# Patient Record
Sex: Male | Born: 1950 | Race: White | Hispanic: No | Marital: Married | Smoking: Current every day smoker
Health system: Western US, Academic
[De-identification: ages and names within clinical notes are randomized; demographics above are authoritative.]

## PROBLEM LIST (undated history)

## (undated) ENCOUNTER — Encounter (HOSPITAL_COMMUNITY): Payer: Self-pay

## (undated) ENCOUNTER — Inpatient Hospital Stay (HOSPITAL_COMMUNITY): Payer: Self-pay | Admitting: Neurosurgery

## (undated) DIAGNOSIS — E785 Hyperlipidemia, unspecified: Secondary | ICD-10-CM

## (undated) DIAGNOSIS — J189 Pneumonia, unspecified organism: Secondary | ICD-10-CM

## (undated) DIAGNOSIS — E119 Type 2 diabetes mellitus without complications: Secondary | ICD-10-CM

## (undated) HISTORY — DX: Type 2 diabetes mellitus without complications: E11.9

## (undated) HISTORY — PX: ROTATOR CUFF REPAIR: SHX139

## (undated) HISTORY — PX: OTHER SURGICAL HISTORY: SHX169

## (undated) HISTORY — PX: HERNIA REPAIR: SHX51

## (undated) HISTORY — PX: PANCREATICODUODENECTOMY: SUR1000

## (undated) HISTORY — DX: Hyperlipidemia, unspecified: E78.5

## (undated) HISTORY — PX: INGUINAL HERNIA REPAIR: SUR1180

## (undated) HISTORY — PX: CARPAL TUNNEL RELEASE: SHX101

## (undated) HISTORY — PX: APPENDECTOMY: SHX54

## (undated) HISTORY — DX: Type 2 diabetes mellitus without complications (CMS-HCC): E11.9

## (undated) HISTORY — DX: Pneumonia, unspecified organism: J18.9

## (undated) SURGERY — DISCECTOMY, CERVICAL, ANTERIOR, WITH INSTRUMENTATION, NEURO
Anesthesia: General

---
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
## 1980-10-23 HISTORY — PX: PB APPENDECTOMY: 44950

## 2000-10-23 HISTORY — PX: OTHER PROCEDURE: U1053

## 2001-10-23 HISTORY — PX: OTHER PROCEDURE: U1053

## 2007-10-24 HISTORY — PX: OTHER PROCEDURE: U1053

## 2008-10-23 HISTORY — PX: OTHER PROCEDURE: U1053

## 2010-06-20 ENCOUNTER — Ambulatory Visit (INDEPENDENT_AMBULATORY_CARE_PROVIDER_SITE_OTHER): Admitting: Surgery

## 2010-06-20 ENCOUNTER — Encounter (INDEPENDENT_AMBULATORY_CARE_PROVIDER_SITE_OTHER): Payer: Self-pay | Admitting: Surgery

## 2010-06-20 VITALS — BP 121/72 | HR 85 | Temp 98.6°F | Resp 12 | Ht 70.5 in | Wt 204.0 lb

## 2010-06-20 MED ORDER — PIOGLITAZONE HCL 15 MG OR TABS
15.00 mg | ORAL_TABLET | Freq: Every day | ORAL | Status: DC
Start: ? — End: 2011-01-17

## 2010-06-20 MED ORDER — FISH OIL 500 MG OR CAPS: 1000.00 mg | ORAL_CAPSULE | Freq: Every day | ORAL | Status: AC

## 2010-06-20 MED ORDER — EZETIMIBE-SIMVASTATIN 10-20 MG OR TABS: 1.00 | ORAL_TABLET | Freq: Every evening | ORAL | Status: AC

## 2010-06-20 MED ORDER — METOPROLOL SUCCINATE 25 MG OR TB24
25.00 mg | ORAL_TABLET | Freq: Every day | ORAL | Status: DC
Start: ? — End: 2011-01-17

## 2010-06-20 MED ORDER — NEXIUM 40 MG OR CPDR
40.00 mg | DELAYED_RELEASE_CAPSULE | Freq: Two times a day (BID) | ORAL | Status: DC
Start: ? — End: 2010-07-22

## 2010-06-20 MED ORDER — OXYCODONE-ACETAMINOPHEN 7.5-500 MG OR TABS
1.00 | ORAL_TABLET | ORAL | Status: DC | PRN
Start: ? — End: 2010-07-22

## 2010-06-20 MED ORDER — ASPIRIN EC 81 MG OR TBEC: 81.00 mg | DELAYED_RELEASE_TABLET | Freq: Every day | ORAL | Status: AC

## 2010-06-20 MED ORDER — NIACIN 500 MG OR CPCR
500.00 mg | ORAL_CAPSULE | Freq: Every day | ORAL | Status: DC
Start: ? — End: 2014-05-05

## 2010-06-20 NOTE — Progress Notes (Signed)
Date: June 20, 2010   Patient Name: Shawn Mejia [facility]   Medical Record #: [id number]   DOB: [birth year]  Age: 58 year old  Sex: male      Referring MD:  Ramin Sorkhi  [address]  [place], [place] [address]    Reason for Visit  Chief Complaint   Patient presents with    Consultation     discuss inguinal hernia          History of Present Illness:     Shawn Mejia is a 59 year old male who presents with a pain in his right inguinal region. He describes the pain as coming on suddenly and sharply in his right groin region and radiating to his right testicle. It lasts for a few seconds and happens multiple times a day. He also has pain at the level of 6/10 constantly in the region.  Denies f/c/n/v. No weight loss or change in appetite. Patient has had four hernia repairs previously. The first was in 2002 for bilateral inguinal hernia repair.  Since then, he had three more on the right side in 2003, 2009, and 2010 to try to fix the groin pain he constantly experiences. Patient says the doctor "cut the nerves" yet nothing helped. Patient also has a recent history of a rising WBC count (11.2 in January 1002, 14.2 05/25/2010, and 15.7 06/02/2010). He had a CT scan done by a doctor in [place] on 06/06/10 that showed a dark area concerning for a neoplasm in the uncinate process of his pancreas. He had a high-resolution MRI done since and is waiting for the results.    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        ezetimibe-simvastatin (VYTORIN) 10-20 MG per tablet Take 1 tablet by mouth every evening.        pioglitazone (ACTOS) 15 MG tablet Take 15 mg by mouth daily.        Niacin 500 MG CPCR Take 500 mg by mouth daily.        Omega-3 Fatty Acids (FISH OIL) 500 MG capsule Take 1,000 mg by mouth daily. 1-2 tabs daily        aspirin (ASPIR-LOW) 81 MG EC tablet Take 81 mg by mouth daily.        metoprolol succinate (TOPROL XL) 25 MG XL tablet Take 25 mg by mouth daily.        oxycodone-acetaminophen (PERCOCET) 7.5-500 MG per tablet Take 1 tablet by mouth every 4 hours as needed.               Social History  History   Social History    Marital Status: Married     Spouse Name: N/A     Number of Children: N/A    Years of Education: N/A   Social History Main Topics    Smoking status: Current Everyday Smoker -- 1.0 packs/day for 30 years     Types: Cigarettes    Smokeless tobacco: Not on file    Alcohol Use: Not on file    Drug Use: Not on file    Sexually Active: Not on file   Other Topics Concern    Not on file   Social History Narrative    No narrative on file         Family History  No family history on file.      Review of Systems    Full ROS can be found on the scanned patient intake form which was review and signed.  Significantly is is positive for occasion abdominal pain.        Physical Exam  Patient is a 59 year old male who appeared alert  BP 121/72   Pulse 85   Temp(Src) 98.6 F (37 C) (Oral)   Resp 12   Ht 5' 10.5" (1.791 m)   Wt 92.534 kg (204 lb)   BMI 28.86 kg/m2  Body mass index is 28.86 kg/(m^2).  General: alert, no distress, cooperative  HEENT: EOMI, conjunctiva clear  Lungs: clear to auscultation and percussion, no chest deformities noted.  Cardiovascular:  Regular rate and rhythm, no MRG  Extremities: Without clubbing cyanosis or edema  Musculoskeletal: Without acute  abnormality  Skin:  negative.  Neuro: Gait normal. Sensation and strength grossly normal.  Abdomen: soft, non-distended. No bulges noticeable in inguinal region. Severe pain to palpation bilateral inguinal region.      Impression:  This is a 59 year old male patient with a chief complaint of severe episodes of right inguinal hernia pain. Has had four previous hernia procedures to try and fix this issue without any benefit. Discussed with the patient his options. Could do an operation to remove the mesh currently in place on his right side along with his testicle and all of the contents of the spermatic cord. Told patient this has about a 50% success rate and is a major procedure with a lot of associated morbidity. Also told patient that Dr. Cathie Hoops specializes in dealing with this type of pain and has a better success rate at 75% for this type of issue.    Plan:  Refer to Dr. Cathie Hoops for a pain management consult.         Note Author: Larinda Buttery, MS3

## 2010-06-22 ENCOUNTER — Telehealth (HOSPITAL_BASED_OUTPATIENT_CLINIC_OR_DEPARTMENT_OTHER): Payer: Self-pay | Admitting: Surgery

## 2010-06-22 NOTE — Telephone Encounter (Signed)
 Patient calling requesting to speak with Lurena Joiner concerning surgery. Patient's call back #[phone]

## 2010-06-22 NOTE — Telephone Encounter (Signed)
 Contacting patient now

## 2010-06-23 ENCOUNTER — Telehealth (HOSPITAL_BASED_OUTPATIENT_CLINIC_OR_DEPARTMENT_OTHER): Payer: Self-pay | Admitting: Family

## 2010-06-23 NOTE — Telephone Encounter (Signed)
 Received outside records from [facility] [id number] for Pancreatic mass records given to [facility] to schedule.

## 2010-07-04 NOTE — Progress Notes (Addendum)
[name] is a 59 year old male referred to office for an opinion regarding treatment of a new pancreas mass that was found incidentally when pt was being worked up for surgery for a right inguinal hernia. Pt states he developed severe right groin pain and left work and was put on medical leave. He was scheduled for an MRI and a pancreas mass was found incidentally. Pt states he has had no symptoms related to the pancreas.He does continue to have right groin pain. Dr Jetta Lout discussed rationale for surgery. Pt asked appropriate questions and verbalized understanding. He would like to proceed with surgery as outlined by Dr Jetta Lout. Consent obtained and  pre-op teaching & instructions given to pt. We will also send him for a CBC & a [place] 19-9 today. Surgery is scheduled for 07/14/10. Pt agreeable to plan. Lula Olszewski RN

## 2010-07-05 ENCOUNTER — Ambulatory Visit (HOSPITAL_BASED_OUTPATIENT_CLINIC_OR_DEPARTMENT_OTHER): Admitting: Surgical Oncology

## 2010-07-05 VITALS — BP 125/79 | HR 79 | Temp 98.2°F | Resp 22 | Ht 70.5 in | Wt 202.2 lb

## 2010-07-05 LAB — CBC WITH DIFF, BLOOD
Abs Basophils: 0.1 10*3/uL (ref 0.0–0.1)
Abs Eosinophils: 0.2 10*3/uL (ref 0.0–0.5)
Abs Lymphs: 4.9 10*3/uL — ABNORMAL HIGH (ref 0.8–3.1)
Abs Monos: 0.8 10*3/uL (ref 0.2–0.8)
Absolute Neutrophil Count: 6.8 10*3/uL (ref 1.6–7.0)
Basophils: 1 % (ref 0–2)
Eosinophils: 2 % (ref 1–7)
Hct: 44.4 % (ref 40.0–50.0)
Hgb: 15.3 g/dL (ref 13.7–17.5)
Lymphocytes: 38 % (ref 19–53)
MCH: 31.4 pg (ref 26.0–32.0)
MCHC: 34.5 % (ref 32.0–36.0)
MCV: 91 um3 (ref 79.0–95.0)
MPV: 9.2 fL — ABNORMAL LOW (ref 9.4–12.4)
Monocytes: 6 % (ref 5–12)
Plt Count: 260 10*3/uL (ref 140–370)
RBC: 4.88 10*6/uL (ref 4.60–6.10)
RDW: 13.9 % (ref 12.0–14.0)
Segs: 53 % (ref 34–71)
WBC: 12.8 10*3/uL — ABNORMAL HIGH (ref 4.0–10.0)

## 2010-07-05 LAB — CA 19-9, BLOOD: CA 19-9: 8 U/mL — ABNORMAL LOW (ref 30–42)

## 2010-07-05 NOTE — Patient Instructions (Signed)
Division of Surgical Oncology  Pre-Operative Instructions    Your surgery will be scheduled within 24 hours of your office visit. The nurse will call you with your surgery date. Prior to your surgery, it is necessary for you to have a pre-operative evaluation. At this appointment you will have some testing done as required by your physician and anesthesiologist. Your pre-op evaluation is scheduled for Monday, September 18th at 8am    This appointment is at [facility] on the 2nd floor in [address]. You do not have to fast for this appointment. The appointment takes approximately 1hour.    The night before your surgery, you should have nothing to eat or drink after midnight. Some patients require a bowel prep. If you do, the nurse will give you additional instructions. If you are taking blood pressure or heart medications, you may take these the morning of your surgery with a small sip of water. Please let the doctor or nurse know if you are taking aspirin, plavix, coumadin, or insulin. You will receive additional instructions for taking these medications. You should stop taking aspirin and aspirin products (advil, motrin, excederin) one week prior to your surgery. You may take Tylenol.    On the day of your surgery, you will need to arrive at the hospital 2 hours before your scheduled surgery time. You will report to the registration desk on your left as you enter the hospital. They will direct you to the 2nd floor.    We understand that some of the information discussed here today may seem overwhelming. If you have questions regarding your surgery or need any clarification about what was discussed today, please feel free to contact Josph Macho, nurse case manager for Dr Jetta Lout at [phone].    Surgery date & time Thursday, September 22nd, 2011 at 7:30 am     Arrival time at [facility] 2 hours prior to surgery or 5:30 am

## 2010-07-05 NOTE — Progress Notes (Signed)
I saw and examined Shawn Mejia along with Dr. Particia Nearing. I am in agreement with her description of the history, physical exam findings and her plan as described above. I discussed the risk of complications including infections such as wound infection, UTI, pneumonia, intraabdominal abscess, and other complications including anastomotic leak, DVT and pulmonary embolism. I quoted a mortality rate of less than 5%. Shawn Mejia voiced his understanding of the plan and his desire to proceed.

## 2010-07-05 NOTE — Progress Notes (Signed)
Surgical Oncology Consultation    Demographics:  Date: July 05, 2010   Patient Name: Shawn Mejia   Medical Record #: [id number]   DOB: May 12, 1951  Age: 59 year old  Sex: male      Referring MD:  Margarita Grizzle Penunuri  [address]  [place], [place] [address]      Chief Complaint   Patient presents with   . Consultation        History of Present Illness:     Shawn Mejia is a 59 year old male with a history of a right inguinal hernia, repaired 4 times, with chronic pain.  He was having a workup for this including CT scan that has found an incidental 1.8 cm pancreatic mass in the uncinate process concerning for pancreatic cancer.  He has been asymptomatic from this pancreatic mass without a history of abdominal pain, jaundice, nausea, or vomiting.  He has had a small amount of weight loss over that last year (20 pounds) but this has been voluntary.  No fevers/chills.  He only has the right groin pain which he describes as stinging pain that feels like he is being kicked in the right groin.  He is concerned about this as well and is going to see a pain specialist next week, but understands that the pancreas is his first priority at this point.  He has been not working for about a month already because of the groin pain.        Past Medical History   Diagnosis Date   . 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 Basal cell carcinoma 2009           Past Surgical History   Procedure Date   . Appendectomy 1982   . Compound fracture right arm 1968   . Carpal tunnel- bilateral 2003   . Open inguinal hernia repair, bilateral 2002   . Right open inguinal hernia repair 2003   . Right open inguinal hernia repair 2009   . Right open inguinal hernia repair 2010           No Known Allergies  Current outpatient prescriptions   Medication Sig   . ezetimibe-simvastatin (VYTORIN) 10-20 MG per tablet Take 1 tablet by mouth every evening.   . pioglitazone (ACTOS) 15 MG tablet Take 15 mg by mouth daily.   . Niacin 500 MG CPCR Take 500 mg by mouth daily.    . Omega-3 Fatty Acids (FISH OIL) 500 MG capsule Take 1,000 mg by mouth daily. 1-2 tabs daily   . metoprolol succinate (TOPROL XL) 25 MG XL tablet Take 25 mg by mouth daily.   Marland Kitchen esomeprazole (NEXIUM) 40 MG capsule Take 40 mg by mouth 2 times daily.   Marland Kitchen aspirin (ASPIR-LOW) 81 MG EC tablet Take 81 mg by mouth daily.   Marland Kitchen oxycodone-acetaminophen (PERCOCET) 7.5-500 MG per tablet Take 1 tablet by mouth every 4 hours as needed.           History   Social History   . Marital Status: Married     Spouse Name: N/A     Number of Children: N/A   . Years of Education: N/A   Social History Main Topics   . Smoking status: Current Everyday Smoker -- 1.0 packs/day for 30 years     Types: Cigarettes   . Smokeless tobacco: Not on file   . Alcohol Use: Not on file   . Drug Use: Not on file   .  Sexually Active: Not on file   Other Topics Concern   . Not on file   Social History Narrative   . No narrative on file     Currently smoking one pack a day for 30 years, works as an Midwife, was in [employer], now works for [employer]       Family History   Problem Relation Age of Onset   . Heart Father          Patient denies family history of any cancers    REVIEW OF SYSTEMS   GEN: The patient has no weight loss, fevers, or chills  EYES:No change in vision.  ENT: No change in hearing. No epistaxis.   PULM: No dyspnea, productive cough, or wheezing  CARDIO:  No chest pain, tachycardias, dyspnea on exertion.  GI: No jaundice, hematesesis, abdominal pain, diarrhea or constipation.  GU: No dysuria or hematuria  ENDO: Borderline diabetes since '99, on actos only.  Last HbA1C was 6.5 in August '11  JOINT:  No new joint pains but has chronic issues with his nerves in both arms from his job and has had multiple surgeries  HEME/LYMPHATIC: No bleeding, anemias, or lymphadenopathy.  NEURO: No loss of consciousness, headaches.    Physical Exam:  BP 125/79  Pulse 79  Temp(Src) 98.2 F (36.8 C) (Oral)  Resp 22  Ht  5' 10.5" (1.791 m)  Wt 91.717 kg (202 lb 3.2 oz)  BMI 28.60 kg/m2  SpO2 96%    GENERAL APPEARANCE: Healthy, alert, no distress, pleasant affect, cooperative.  EYES: Conjunctivae and corneas clear. PERRL, EOM's intact. Fundi benign.   EARS:  Normal TMs and canal.  NOSE:  Normal.  MOUTH:  Normal.   NECK:  Neck supple.  No adenopathy, thyroid symmetric  LYMPH NODES:  Non-palpable.   HEART: Normal rate and regular rhythm, no murmurs, clicks, or gallops.  LUNGS: Clear to auscultation and percussion, no chest deformities noted.  ABDOMEN:  BS normal. Abdomen soft, non-tender. No masses or organomegaly.  Right groin painful to touch, no hernia palpable.  EXTREMITIES:  No cyanosis, clubbing, or edema   SKIN:  Negative  NEURO: Non-focal.      CT scan:  1.8 cm mass in the uncinate process of the pancreas  MRI:  3 cm mass in the uncinate process, appears to have lobulated areas    Last labs: wbc 15.7 from outside records    Assessment and Care Plan:  In summary, this patient is a 59 year old male with a newly diagnosed uncinate process pancreatic mass, consistent with pancreatic cancer, incidentally found on workup for groin pain    1.  Discussed with the patient and his wife - this is most likely a pancreatic cancer based on the imaging, and the best option for him at this point is surgical resection with a Whipple procedure.  A biopsy would not be helpful because, even if negative, it would not change our management and a negative result would not rule out a cancer (sampling error).  2.  His groin pain is obviously still an issue but his pancreatic cancer should take precedence.  He will be seeing the pain specialist next Monday to discuss options with them  3.  Will plan for Whipple procedure next week, 07-14-10, the procedure and possible complications were explained in detail to him and his wife, and they would like to proceed.

## 2010-07-06 ENCOUNTER — Encounter (INDEPENDENT_AMBULATORY_CARE_PROVIDER_SITE_OTHER): Payer: Self-pay | Admitting: Anesthesiology

## 2010-07-07 ENCOUNTER — Encounter (INDEPENDENT_AMBULATORY_CARE_PROVIDER_SITE_OTHER): Admitting: Anesthesiology

## 2010-07-08 ENCOUNTER — Telehealth (HOSPITAL_BASED_OUTPATIENT_CLINIC_OR_DEPARTMENT_OTHER): Payer: Self-pay | Admitting: Surgical Oncology

## 2010-07-08 NOTE — Telephone Encounter (Signed)
CPT 48150    INPT SURGERY W/ DR LOWY DOS 9.22.11    AUTH# [id number]    APPROVED      EXP 12.12.11

## 2010-07-08 NOTE — Progress Notes (Signed)
I have reviewed the detailed encounter note.  They fully reflect my opinion in regards to the management of this patient after having personally interviewed and examined the patient, in addition I have personally edited their note as needed for accuracy.      Shemiah Rosch R. Reka Wist MD

## 2010-07-11 ENCOUNTER — Ambulatory Visit (INDEPENDENT_AMBULATORY_CARE_PROVIDER_SITE_OTHER): Payer: Self-pay | Admitting: Anesthesiology

## 2010-07-11 ENCOUNTER — Encounter (INDEPENDENT_AMBULATORY_CARE_PROVIDER_SITE_OTHER): Payer: Self-pay | Admitting: Anesthesiology

## 2010-07-11 ENCOUNTER — Ambulatory Visit (INDEPENDENT_AMBULATORY_CARE_PROVIDER_SITE_OTHER): Admitting: Anesthesiology

## 2010-07-11 VITALS — BP 115/83 | HR 70 | Temp 98.4°F | Resp 12

## 2010-07-11 VITALS — BP 124/71 | HR 80 | Temp 98.6°F | Resp 18 | Ht 70.5 in | Wt 207.7 lb

## 2010-07-11 MED ORDER — GABAPENTIN 6% KETOPROFEN 10% LIDOCAINE 10% EX CREM (COMPOUNDED)
Freq: Two times a day (BID) | TOPICAL | Status: DC
Start: 2010-07-11 — End: 2011-01-17

## 2010-07-11 MED ORDER — GABAPENTIN 300 MG OR CAPS
300.0000 mg | ORAL_CAPSULE | Freq: Three times a day (TID) | ORAL | Status: DC
Start: 2010-07-11 — End: 2010-08-30

## 2010-07-11 MED ORDER — NORTRIPTYLINE HCL 10 MG OR CAPS
10.00 mg | ORAL_CAPSULE | Freq: Every evening | ORAL | Status: AC
Start: 2010-07-11 — End: ?

## 2010-07-11 NOTE — Progress Notes (Signed)
 Preoperative Anesthesiology Progress Note   The patient seen in the Preoperative [facility] Center as part of a preoperative anesthesia evaluation.   A thorough PMH and PSH was obtained and focused physical exam was done. The type of anesthesia was discussed and all the patients questions were answered.   At the conclusion of the visit, detailed preoperative instructions were given including:   -NPO instructions   -Preoperative medication instructions (prescription medications, supplements, vitamins, OTC medications

## 2010-07-11 NOTE — Progress Notes (Signed)
Chief Complaint: right groin pain    Referring Physician Wesley Blas    Primary Care Physician Lacretia Leigh, MD    History of Present Illness:  This is a 59 year old male referred to our clinic for evaluation of right groin pain after 4 inguinal hernia repairs/revisions. The problem began approximately 9-10 years ago, acute in onset after inguinal hernia repair. The patient had a bilateral inguinal hernia repair which he was recovering from and then strained the repair on the right. The patient tore the repair on the right and subsequently developed a chronic right groin pain. He has had 3 subsequent hernia revisions since then to repair the tear followed by revisions for the pain, with a severing of the ilioinguinal/iliohypogastric nerve. He has been seen by 3 pain physicians who have attempted injections and medications without significant relief. The pain is improved by nothing and exacerbated by activity. Other concerns and complaints include uncinate mass concerning for pancreatic cancer, patient scheduled for Whipple procedure on 07/14/2010.    Current Description of Symptoms:  Patient stated their pain today is 5/10. On the pain diagram today the patient shades in the areas of their right groin.  They describe their pain as constant, intermittent, moderate and severe aching, nagging, throbbing, radiating, cutting, burning, sharp, shooting, stabbing and dull. Patient states their pain is associated with none. This pain has made it hard for the patient to walk, sit, work and exercise. Patient states pain is worse morning (6-9am), mid morning (9-12pm), afternoon (12-3pm), late afternoon (3-6pm), evening (6-9pm), late evening (9-midnight), night (12am-6am), can't predict and with activity.    Over the last 7 days the patient's pain has been at its worst 10/10, at best 5/10 and averages 7/10.     Past Medical History   Diagnosis Date   . 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 Basal cell carcinoma 2009        Past Surgical History   Procedure Date   . Appendectomy 1982   . Compound fracture right arm 1968   . Carpal tunnel- bilateral 2003   . Open inguinal hernia repair, bilateral 2002   . Right open inguinal hernia repair 2003   . Right open inguinal hernia repair 2009   . Right open inguinal hernia repair 2010       History   Social History   . Marital Status: Married     Spouse Name: N/A     Number of Children: N/A   . Years of Education: N/A   Occupational History   . Lab director    Social History Main Topics   . Smoking status: Current Everyday Smoker -- 1.0 packs/day for 30 years     Types: Cigarettes   . Smokeless tobacco: Never Used   . Alcohol Use: Yes   . Drug Use: Not on file   . Sexually Active: Not on file   Other Topics Concern   . Not on file   Social History Narrative   . No narrative on file       Additional Social History:   Currently working/school: yes - Tree surgeon  Open legal case related to pain: no  Alcohol or substance abuse/use: no  History of DUI: no. History of alcohol/substance abuse treatment: no  Current exercise: no  History of Depression/Anxiety/Mental Illness: no    Family History   Problem Relation Age of Onset   . Heart Father        Additional  Family History:  Family history of Alcoholism: no  Family history of Substance Abuse: no    Review of Systems:  General: Loss of appetite/weight loss and Poor sleep  Cardiovascular: Negative  Gastrointestinal: Nausea/vomiting  Genito/Reproductive: Difficulty with sex due to pain  Endocrine: Negative  Psychiatry: Anxiety and Difficulty staying asleep  EENT: Hoarseness/difficulty swallowing  Respiratory: Cough/wheezing  Urinary: Pain or burning on urination  Musculoskeletal: Muscle pain  Skin: Negative  Neurological: Negative  Remainder of systems negative    Diagnostic History:   Patient has had the following tests to evaluate their pain   MRI: abdomen 06/17/2010  Pancreatic mass 1.7x1.5x3.1 at the uncinate process with pancreatic  duct dilation  Abdominal wall is without defects, no evidence of hernia     Therapeutic History:   Patient has seen other pain providers to treat the current problem.   Prior interventional pain procedures include: ilioinguinal injection - no relief  Prior non interventional pain treatments include: medication  Patient has tried the following pain medications: tramadol, hydrocodone, oxycodone, Tylenol with codeine, ibuprofen, naproxen, asa, acetaminophen    Current outpatient prescriptions   Medication Sig Dispense Refill   . esomeprazole (NEXIUM) 40 MG capsule Take 40 mg by mouth 2 times daily.       Marland Kitchen ezetimibe-simvastatin (VYTORIN) 10-20 MG per tablet Take 1 tablet by mouth every evening.       . pioglitazone (ACTOS) 15 MG tablet Take 15 mg by mouth daily.       . Niacin 500 MG CPCR Take 500 mg by mouth daily.       . Omega-3 Fatty Acids (FISH OIL) 500 MG capsule Take 1,000 mg by mouth daily. 1-2 tabs daily       . aspirin (ASPIR-LOW) 81 MG EC tablet Take 81 mg by mouth daily.       . metoprolol succinate (TOPROL XL) 25 MG XL tablet Take 25 mg by mouth daily.       Marland Kitchen oxycodone-acetaminophen (PERCOCET) 7.5-500 MG per tablet Take 1 tablet by mouth every 4 hours as needed.             Patient is not currentlyon any anticoagulation medications    No Known Allergies    Physical Exam:   Vitals: BP 115/83  Pulse 70  Temp(Src) 98.4 F (36.9 C) (Oral)  Resp 12  General:  Well-developed, well-nourished, cooperative, in no acute distress.  Mental Status:  Alert, oriented x3. Speech is clear and fluent.  Affect:  Euthymic.  Skin:  No rashes or bruises.  HEENT:  Pupils equal, not pinpoint.  Pulmonary:  Breathing easily without tachypnea or bradypnea.  Cardiac:  No LE edema.  Abdomen:  Soft, Non-tender, Not distended.  Ambulation: Pt is able to raise from a seated position without difficulty. Gait is not antalgic and the patient ambulates without assistance.   Genitourinary: exquisite tenderness to palpation along prior  surgical scar, normal testicles, no other palpable masses  Neurosensory:    Motor exam: bilateral 5/5 deltoid, biceps, triceps, WE, grip strength, iliopsoas, KF,KE, dorsiflexion, plantarflexion.   Sensory exam: normal sensation to light touch and pinprick.    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 At this time the patient is scheduled for a Whipple procedure with Dr. Jetta Lout and we do not recommend any interventions at this time. While it is this clinic's policy to have primary  care physician prescribe recommended medications, given the proximity of his surgery we will prescribe our recommended medication with the understanding that his primary care physician will take over prescription writing.    PLAN  Interventions: After the patient has recovered from his surgery and assuming that this groin pain does not improve or change, we are recommending an ultrasound guided ilioinguinal nerve injection at [facility]. The patient has been educated regarding the risks, benefits, and alternatives. The patient understands and is eager to proceed. Once we assess the patient's response we may or may not consider pulsed radiofrequency ablation of the ilioinguinal nerve.      Medication recommendations:   1) Addition of gabapentin-ketoprofen-lidocaine compounded ointment applied to the affected area bid  2) Addition of gabapentin under the following schedule.  Start Gabapentin 300mg  PO QHS, then tirate up as tolerated.  If patient experiences side effects such as dizziness, sedation or confusion then hold titration and/or reduce by one tablet until the side effects resovle.  Titrate up  Every 3-5 days as follows:                                    Number of Pills  DAYS               AM        PM         BEDTIME    1-3                     0             0               1  6-8                      0             0               2  9-11                   1             0               2  12-14                 1             1               2   15-17                 2             1               2   18-20                 2             2               2   21-23                 2             2               3   24-26  3             2               3   Thereafter          3             3               3     3) Addition of nortriptyline 10mg  QHS for neuropathic pain and improved sleep. Nortriptyline like all TCA's has the potential to cause hypotension and or tachycardia, although less so than amitriptyline.     We also recommend the patient enter a smoking cessation course as cigarette smoking is known to worsen pain symptoms.     Thank you for the consultation, please call with any questions.

## 2010-07-11 NOTE — Progress Notes (Signed)
 Attending Note:    Subjective:  I reviewed the history.  Patient interviewed and examined.  History of present illness (HPI):  Consultation     Review of Systems (ROS): As per the fellow's note.  Past Medical, Family, Social History:  As per the fellow's  note.    Objective:   I have examined the patient and I concur with the fellow's exam.  Assessment and plan reviewed with the fellow. I agree with the fellow's plan as documented.  See the fellow's note for further details.

## 2010-07-11 NOTE — Patient Instructions (Addendum)
Preoperative Anesthesiology Instructions  -Nothing to Eat or Drink after midnight the night before your procedure.  -Take all morning  Medications (nexium and metoprolol)  the morning of procedure with sips of water - except no morning Actos  -No aspirin, Aleve, Ibuprofen, Motrin, Advil for 7 days prior to your procedure - today  -Only take tylenol for pain prior to procedure.  -No omega or multi vitamin for 7 days prior to your procedure - today

## 2010-07-11 NOTE — Patient Instructions (Signed)
Start Gabapentin 300mg  PO QHS, then tirate up as tolerated.  If patient experiences side effects such as dizziness, sedation or confusion then hold titration and/or reduce by one tablet until the side effects resovle.  Titrate up  Every 3-5 days as follows:                                    Number of Pills  DAYS               AM        PM         BEDTIME    1-3                     0             0               1  6-8                     0             0               2  9-11                   1             0               2  12-14                 1             1               2   15-17                 2             1               2   18-20                 2             2               2   21-23                 2             2               3   24-26                 3             2               3   Thereafter          3             3               3

## 2010-07-14 ENCOUNTER — Observation Stay
Admit: 2010-07-14 | Discharge: 2010-07-22 | Disposition: A | Discharge status: Home Health | Payer: Self-pay | Attending: Surgical Oncology | Admitting: Surgical Oncology

## 2010-07-14 MED ORDER — SODIUM CHLORIDE 0.9 % IV SOLN
12.5000 mg | Freq: Once | INTRAVENOUS | Status: DC | PRN
Start: 2010-07-14 — End: 2010-07-14

## 2010-07-14 MED ORDER — LACTATED RINGERS IV SOLN
INTRAVENOUS | Status: DC
Start: 2010-07-14 — End: 2010-07-15

## 2010-07-14 MED ORDER — METOCLOPRAMIDE HCL 5 MG/ML IJ SOLN
10.0000 mg | Freq: Four times a day (QID) | INTRAMUSCULAR | Status: DC | PRN
Start: 2010-07-14 — End: 2010-07-22

## 2010-07-14 MED ORDER — GLUCOSE 4 GM PO CHEW (CUSTOM)
4.0000 | CHEWABLE_TABLET | ORAL | Status: DC | PRN
Start: 2010-07-14 — End: 2010-07-22

## 2010-07-14 MED ORDER — ENOXAPARIN SODIUM 40 MG/0.4ML SC SOLN
40.0000 mg | Freq: Every day | SUBCUTANEOUS | Status: DC
Start: 2010-07-15 — End: 2010-07-22
  Administered 2010-07-15 – 2010-07-22 (×8): 40 mg via SUBCUTANEOUS
  Filled 2010-07-14 (×8): qty 0.4

## 2010-07-14 MED ORDER — DEXTROSE (DIABETIC USE) 40 % OR GEL
1.0000 | ORAL | Status: DC | PRN
Start: 2010-07-14 — End: 2010-07-22

## 2010-07-14 MED ORDER — FAMOTIDINE IN NACL 20 MG/50ML IV SOLN
20.0000 mg | Freq: Two times a day (BID) | INTRAVENOUS | Status: DC
Start: 2010-07-14 — End: 2010-07-21
  Administered 2010-07-14 – 2010-07-18 (×7): 20 mg via INTRAVENOUS
  Filled 2010-07-14 (×7): qty 20

## 2010-07-14 MED ORDER — NALOXONE HCL 0.4 MG/ML IJ SOLN
0.1000 mg | INTRAMUSCULAR | Status: DC | PRN
Start: 2010-07-14 — End: 2010-07-22

## 2010-07-14 MED ORDER — METOPROLOL TARTRATE 5 MG/5ML IV SOLN
5.0000 mg | Freq: Four times a day (QID) | INTRAVENOUS | Status: DC
Start: 2010-07-14 — End: 2010-07-20
  Administered 2010-07-14 – 2010-07-20 (×20): 5 mg via INTRAVENOUS
  Filled 2010-07-14 (×20): qty 5

## 2010-07-14 MED ORDER — FENTANYL CITRATE 0.05 MG/ML IJ SOLN
25.0000 ug | INTRAMUSCULAR | Status: DC | PRN
Start: 2010-07-14 — End: 2010-07-14

## 2010-07-14 MED ORDER — HYDROMORPHONE HCL 1 MG/ML IJ SOLN
0.4000 mg | INTRAMUSCULAR | Status: DC | PRN
Start: 2010-07-14 — End: 2010-07-14

## 2010-07-14 MED ORDER — METRONIDAZOLE IN NACL 5-0.79 MG/ML-% IV SOLN
500.0000 mg | Freq: Once | INTRAVENOUS | Status: DC
Start: 2010-07-14 — End: 2010-07-14

## 2010-07-14 MED ORDER — DEXTROSE 50 % IV SOLN
12.5000 g | INTRAVENOUS | Status: DC | PRN
Start: 2010-07-14 — End: 2010-07-22

## 2010-07-14 MED ORDER — METRONIDAZOLE IN NACL 5-0.79 MG/ML-% IV SOLN
500.0000 mg | Freq: Three times a day (TID) | INTRAVENOUS | Status: AC
Start: 2010-07-14 — End: 2010-07-14
  Administered 2010-07-14 (×2): 500 mg via INTRAVENOUS
  Filled 2010-07-14 (×2): qty 100

## 2010-07-14 MED ORDER — NARCOTIC DRIP (FOR PYXIS) PLACEHOLDER
Status: AC
Start: 2010-07-14 — End: 2010-07-15
  Filled 2010-07-14: qty 1

## 2010-07-14 MED ORDER — FAMOTIDINE 20 MG OR TABS
20.0000 mg | ORAL_TABLET | Freq: Two times a day (BID) | ORAL | Status: DC
Start: 2010-07-14 — End: 2010-07-21
  Administered 2010-07-17 – 2010-07-21 (×7): 20 mg via ORAL
  Filled 2010-07-14 (×9): qty 1

## 2010-07-14 MED ORDER — GLUCAGON HCL (RDNA) 1 MG IJ SOLR
1.0000 mg | Freq: Once | INTRAMUSCULAR | Status: DC | PRN
Start: 2010-07-14 — End: 2010-07-22

## 2010-07-14 MED ORDER — INSULIN REGULAR HUMAN 100 UNIT/ML IJ SOLN
1.0000 [IU] | Freq: Four times a day (QID) | INTRAMUSCULAR | Status: DC
Start: 2010-07-14 — End: 2010-07-22
  Filled 2010-07-14 (×4): qty 1
  Filled 2010-07-14: qty 2
  Filled 2010-07-14: qty 1

## 2010-07-14 MED ORDER — CEFAZOLIN SODIUM 1 GM IJ SOLR
1000.00 mg | Freq: Three times a day (TID) | INTRAMUSCULAR | Status: AC
Start: 2010-07-14 — End: 2010-07-15
  Administered 2010-07-14 – 2010-07-15 (×2): 1000 mg via INTRAVENOUS
  Filled 2010-07-14 (×2): qty 1000

## 2010-07-14 MED ORDER — HEPARIN SODIUM (PORCINE) 10000 UNIT/ML IJ SOLN
5000.0000 [IU] | Freq: Three times a day (TID) | INTRAMUSCULAR | Status: DC
Start: 2010-07-14 — End: 2010-07-14
  Administered 2010-07-14: 5000 [IU] via SUBCUTANEOUS
  Filled 2010-07-14: qty 0.5

## 2010-07-14 MED ORDER — SODIUM CHLORIDE 0.9 % IV SOLN
1000.0000 mg | Freq: Three times a day (TID) | INTRAVENOUS | Status: DC
Start: 2010-07-14 — End: 2010-07-14

## 2010-07-14 MED ORDER — DIPHENHYDRAMINE HCL 50 MG/ML IJ SOLN
12.5000 mg | Freq: Once | INTRAMUSCULAR | Status: DC | PRN
Start: 2010-07-14 — End: 2010-07-14

## 2010-07-14 MED ORDER — SODIUM CHLORIDE 0.9 % IV SOLN
1000.0000 mg | Freq: Once | INTRAVENOUS | Status: DC
Start: 2010-07-14 — End: 2010-07-14

## 2010-07-14 MED ORDER — ONDANSETRON HCL 4 MG/2ML IV SOLN
4.0000 mg | Freq: Once | INTRAMUSCULAR | Status: DC | PRN
Start: 2010-07-14 — End: 2010-07-14

## 2010-07-14 MED ORDER — HYDROMORPHONE PCA 0.2 MG/ML SYRINGE
INTRAMUSCULAR | Status: DC
Start: 2010-07-14 — End: 2010-07-16
  Filled 2010-07-14 (×3): qty 50

## 2010-07-14 MED ORDER — FENTANYL CITRATE 0.05 MG/ML IJ SOLN
50.0000 ug | INTRAMUSCULAR | Status: DC | PRN
Start: 2010-07-14 — End: 2010-07-14

## 2010-07-14 MED ORDER — NALOXONE HCL 0.4 MG/ML IJ SOLN
0.1000 mg | INTRAMUSCULAR | Status: DC | PRN
Start: 2010-07-14 — End: 2010-07-14

## 2010-07-14 NOTE — Procedures (Signed)
 PERFORMED ON - 07/14/2010 20:10:00;   DONE BY - Dairl Ponder;  PROCEDURE - OXYGEN-LOW FLOW;   PROTOCOL DRIVEN: YES-MD INITIATED;   OXYGEN CHECK: DONE;   ADVERSE REACTIONS: NONE;   ELECTRONIC SIGNATURE DERIVED FROM A SINGLE CONTROLLED ACCESS PASSWORD:   Dairl Ponder ; 07/14/2010 20:25:59

## 2010-07-14 NOTE — Interdisciplinary (Signed)
Report to Terra RN

## 2010-07-14 NOTE — Interdisciplinary (Signed)
Pt rcvd from pacu. Pt very groggy, unable to maintain wakefulness for more than a few seconds, very slow to respond to questions. Wife at bedside, explained pca to pt and wife, pt barely able to return demonstration, cont to educate.

## 2010-07-14 NOTE — Interdisciplinary (Signed)
Closely monitoring pt's pain and respiratory status, pt placed back on simple mask 7l, desaturating in high 80s. Pt c/o pain instructed on pca, pt verbalized understanding, still very groggy and sedated.

## 2010-07-14 NOTE — Interdisciplinary (Signed)
Pt desaturating, enc cough deep breathing, increased o2 to 6lnc.

## 2010-07-14 NOTE — Procedures (Signed)
 PERFORMED ON - 07/14/2010 20:26:15;   DONE BY - Dairl Ponder;  PROCEDURE - PDP EVALUATION;   NEURO STATUS: AWAKE;   HEARTRATE: 79;   RESP RATE: 17;   BREATH SOUNDS: DIMINISHED BILATERALLY;   ORDERING PHYSICIAN: LOWY;   CODE STATUS CHECKED: YES;   HEMOGLOBIN: 13.8;   CLUBBING PRESENT: NO;   TRACHEOSTOMY PRESENT: NO;   SOB: NO;   CHEST EXCURSION: SYMMETRICAL;   COUGH EFFORT: NONE;   SPUTUM PRODUCTION: NONE;   TITRATED FIO2 OR L/M: 7;   POST TITRATION SAO2: 95;   ABG-DATE/TIME: 07/14/2010 07:58:00;   ABG-FIO2: .53;   PH: 7.39;   PAO2: 124;   PACO2: 42;   ABG SAO2: 98;   CHEST X-RAY: PENDING;   MD REQUEST #1: 02;   RCP RECOMMENDATION: #1: O2 PROTOCOL;   INDICATION: #1: O: TO KEEP SA02 >92;   EVALUATION OUTCOME: #1: AS RCP RECOMMENDED;   ELECTRONIC SIGNATURE DERIVED FROM A SINGLE CONTROLLED ACCESS PASSWORD:   Dairl Ponder ; 07/14/2010 20:26:15

## 2010-07-14 NOTE — Interdisciplinary (Signed)
Assisted with IS.  Oriented to pca button.

## 2010-07-14 NOTE — Interdisciplinary (Signed)
Dr. Particia Nearing @ bedside. No new orders.

## 2010-07-14 NOTE — Interdisciplinary (Signed)
Pt desating to 88-90, placed on simple mask, sats improved to 92.

## 2010-07-14 NOTE — Interdisciplinary (Signed)
Paged MD to receive clarification, no orders present for ng to suction.

## 2010-07-14 NOTE — Brief Op Note (Signed)
BRIEF OP NOTE    DATE OF SURGERY: 07/14/2010    PREOP DIAGNOSIS: Pancreatic Mass   POSTOP DIAGNOSIS: Same as above    PROCEDURE: Whipple    SURGEON: A.Lowy    ASSISTANTS: J.Linnie Delgrande     ANESTHESIA: GETA    FLUIDS: Crystalloid:2500  Colloid: 0   EBL: 50  UOP: 500    LINES/TUBES/DRAINS: A-Line, NGT, Foley    SPECIMENS: 1. Pancreaticoduodectomy  2. Bile duct margin  3. Pancreas margin    COMPLICATIONS: None    FINDINGS: Firm mass in the head of the pancreas. No leak evident from choledochojejunostomy, duodenojejunostomy or pancreaticojejunosotomy prior to closure. For further information see dictated report pending.     DISPO: Stable to PACU

## 2010-07-14 NOTE — H&P (Signed)
HISTORY & PHYSICAL - INTERVAL ASSESSMENT      IWAO SHAMBLIN  [id number]      This interval assessment is required for History & Physical completed less than 30 days but more than 24 hours prior to the admission or surgery. A History & Physical completed more than 30 days prior to the admission or surgery must be repeated.    Current Medical Status:  Unchanged    Medications / Allergies:  Unchanged    Review of Systems:  Unchanged    Physical Examination:  Unchanged    Laboratory or Clinical Data:  Unchanged    Modifications of Initial Care Plan:  Unchanged        Reather Littler, MD     07/14/2010     7:07 AM

## 2010-07-14 NOTE — Interdisciplinary (Signed)
Holding in [place] for IMU bed.

## 2010-07-15 MED ORDER — IPRATROPIUM-ALBUTEROL 0.5-2.5 (3) MG/3ML IN SOLN
3.0000 mL | RESPIRATORY_TRACT | Status: DC | PRN
Start: 2010-07-15 — End: 2010-07-22
  Administered 2010-07-15: 3 mL via RESPIRATORY_TRACT
  Filled 2010-07-15 (×2): qty 1

## 2010-07-15 MED ORDER — HYDROMORPHONE HCL 1 MG/ML IJ SOLN
INTRAMUSCULAR | Status: AC
Start: 2010-07-15 — End: 2010-07-15
  Filled 2010-07-15: qty 1

## 2010-07-15 MED ORDER — ALBUTEROL SULFATE 108 (90 BASE) MCG/ACT IN AERS
2.0000 | INHALATION_SPRAY | RESPIRATORY_TRACT | Status: DC
Start: 2010-07-15 — End: 2010-07-15

## 2010-07-15 MED ORDER — KETOROLAC TROMETHAMINE 30 MG/ML IJ SOLN
30.00 mg | Freq: Four times a day (QID) | INTRAMUSCULAR | Status: AC
Start: 2010-07-15 — End: 2010-07-19
  Administered 2010-07-15 – 2010-07-19 (×18): 30 mg via INTRAVENOUS
  Filled 2010-07-15 (×18): qty 1

## 2010-07-15 MED ORDER — HYDROMORPHONE HCL 1 MG/ML IJ SOLN
1.00 mg | Freq: Once | INTRAMUSCULAR | Status: AC
Start: 2010-07-15 — End: 2010-07-15

## 2010-07-15 MED ORDER — ALBUTEROL SULFATE 108 (90 BASE) MCG/ACT IN AERS
2.0000 | INHALATION_SPRAY | RESPIRATORY_TRACT | Status: DC | PRN
Start: 2010-07-15 — End: 2010-07-22

## 2010-07-15 MED ORDER — DEXTROSE-NACL 5-0.45 % IV SOLN (CUSTOM)
INTRAVENOUS | Status: DC
Start: 2010-07-15 — End: 2010-07-19

## 2010-07-15 MED ORDER — SODIUM CHLORIDE 0.9 % IV BOLUS
500.00 mL | INJECTION | Freq: Once | INTRAVENOUS | Status: AC
Start: 2010-07-16 — End: 2010-07-15
  Administered 2010-07-15: 500 mL via INTRAVENOUS

## 2010-07-15 MED ORDER — LORAZEPAM 2 MG/ML IJ SOLN
INTRAMUSCULAR | Status: DC
Start: 2010-07-15 — End: 2010-07-16
  Filled 2010-07-15: qty 1

## 2010-07-15 NOTE — Op Note (Signed)
Dictating Practitioner: Hulan Amato. Jetta Lout, M.D.     Staff Physician: Hulan Amato. Jetta Lout, M.D.    Date of Operation: 07/14/2010        PREOPERATIVE DIAGNOSIS: Pancreatic head mass.    POSTOPERATIVE DIAGNOSES: Pancreatic head mass.    PROCEDURE PERFORMED  1. Pylorus-preserving pancreatoduodenectomy.  2. Cholecystectomy.  3. Insertion of Foley catheter.    SURGEON/STAFF: Hulan Amato. Jetta Lout, MD    ASSISTANT: Val Eagle, MD    ANESTHESIA: General endotracheal.    ESTIMATED BLOOD LOSS: Less than 100 mL.    COMPLICATIONS: None.    INDICATIONS: The patient is a 59 year old white male who had undergone a CT  scan to evaluate groin pain and was found to have a low density mass in the  pancreatic head uncinate process. MRI was performed which confirmed the  presence of the mass as well as demonstrating a pancreatic duct that was 2  to 3 times normal size. Given these findings, I discussed with the patient  the high likelihood this represented a neoplasm and the patient wish to  proceed with pancreaticoduodenectomy. The risks and benefits were discussed  in detail and the patient wished to proceed.    PROCEDURE IN DETAIL: The patient was brought to the operating room and  placed on the table in the supine position. After smooth induction of  endotracheal intubation the abdomen was shaved and prepped and draped in  the usual sterile fashion following insertion of a Foley catheter.    An upper midline incision was created and extended just below the  umbilicus. The subcutaneous tissues are divided. The midline fascia was  then divided and the peritoneal cavity entered. Exploration revealed no  evidence of metastatic disease. There was a palpable mass in the pancreatic  head which was concordant with the imaging findings.    We proceeded to take down adhesions of the omentum to the gallbladder and  then mobilized the hepatic flexure of the colon. We then entered the lesser  sac through the gastrocolic ligament and opened this widely  using the  LigaSure. We divided the right gastroepiploic vein and the middle colic  vein and then identified the infrapancreatic superior mesenteric vein which  was uninvolved by the mass. We then proceeded to dissect the porta hepatis,  first removing the gallbladder, dissecting it from its peritoneal  attachments to the liver and then dividing the cystic artery and ligating  the cystic duct with silk. We then dissected the common bile duct, divided  it with cautery and sent a margin for frozen section, which revealed no  evidence of malignancy.    We then dissected the gastroduodenal artery, which was initially clamped  with a vascular clamp. We confirmed excellent pulsation in the hepatic  artery. We then ligated the GDA with a suture ligature and silk. Next we  divided the gastroepiploic vessels just distal to the pylorus and cleared  tissue from the superior to the duodenum such that we could divide the  duodenum approximately 3 to 4 cm distal to the pylorus with a single firing  of the GIA stapler. We preserved the right gastric artery. Next we divided  the small bowel approximately 10 to 15 cm distal to ligament of Treitz  using a single firing of the stapler. We oversewed the staple line with  interrupted 3-0 silk Lembert sutures. We then took down the small bowel  mesentery, proximal jejunal and duodenal mesentery using the LigaSure until  we could pass the bowel beneath  the superior mesenteric vessels to the  patient's right.    We then placed stay sutures in the pancreatic neck on either side of the  SMV portal vein and then divided the pancreatic neck using cautery. We then  dissected the pancreatic head and uncinate process from the SMV and SMA  primarily using the Harmonic Focus device. The medial extent of dissection  was the right lateral border of the SMA. The specimen was removed and the  pancreatic neck margin sent for frozen, which revealed no evidence of  malignancy.    We then closed the  duodenal defect with interrupted figure-of-eight 3-0  silk sutures. We created a rent in the transverse mesocolon and brought the  proximal small bowel through and then performed our pancreatic anastomosis  using an end to pancreas, side to jejunum, 2-layer technique of 3-0 silk  Lembert sutures and then a 4-0 Vicryl duct to mucosa suture. Next,  approximately 10 cm distally, we created our choledochojejunostomy with a  single layer of 4-0 PDS and then created a retrocolic duodenojejunostomy in  2 layers using silk and PDS. We tacked the jejunal limb to the transverse  mesocolon with silk.    We irrigated copiously, assured hemostasis, which was excellent, and then  closed the fascia with running 1-0 PDS staples. Sterile dressings were  applied. The patient awoke, was extubated and transported to recovery  having tolerated the procedure well. All needle, sponge, and instrument  counts were correct at the end of the case.    Total operative time was just under 4 hours.                          Electronically signed by:  Hulan Amato. Jetta Lout, M.D. 07/21/2010 09:53 P    DD: 07/14/2010 DT: 07/15/2010 03:29 P DocNo.: [id number]  [id number].[facility]    Referring Physician:  Georgia Dom MD  [address] [place], [place] [address]    Primary Care Physician:  Lacretia Leigh M.D.  [address]  [place], [place] [address]    cc:

## 2010-07-15 NOTE — Interdisciplinary (Signed)
07/15/10 1357   Patient Information   Why is Patient in the Hospital? whipple   Prior to Level of Function Ambulatory/Independent with ADL's   Discharge Planning   Living Arrangements Spouse / significant other   Support Systems Spouse / significant other   Patient expects to be discharged to: home   Do you have transportation home?  Yes  (wife)      Patient lives with wife at home. Does not have FWW. Has not had HHN in the past. Currently with NG, foley,pca. 75% high flow o2 with o2 sats ranging from 80's to 94%. Currently POD 1

## 2010-07-15 NOTE — Interdisciplinary (Signed)
Pt assisted back to bed after sitting in chair since 1300. Pt tolerated OOB well with better pain control since Toradol was added this afternoon in addition to his dilaudid PCA. Pain was rated 1/10 after settled back to bed, pt is sleepy and wishes to be undisturbed for now. NG Tube continues with green drainage, pt was able to have FiO2 turned down from 75% to 60% with no need for neb treatment per RT. Urine output is minimal-will notifiy MD. Wife & daughter are at bedside. have been helpful and supportive.

## 2010-07-15 NOTE — Procedures (Signed)
 PERFORMED ON - 07/15/2010 23:00:39;   DONE BY - JARJIS, STEVE S;  PROCEDURE - STANDBY;   ADVERSE REACTIONS: NONE;   COMMENT: TRIED TO REPLACE PT'S MASK, PT REFUSED  ELECTRONIC SIGNATURE DERIVED FROM A SINGLE CONTROLLED ACCESS PASSWORD:   Rolm Gala ; 07/15/2010 23:56:39

## 2010-07-15 NOTE — Procedures (Signed)
 PERFORMED ON - 07/15/2010 23:00:05;   DONE BY - JARJIS, STEVE S;  PROCEDURE - OXYGEN-HIGH FLOW;   PROTOCOL DRIVEN: YES-MD INITIATED;   OXYGEN CHECK: DONE;   ADVERSE REACTIONS: NONE;   ELECTRONIC SIGNATURE DERIVED FROM A SINGLE CONTROLLED ACCESS PASSWORD:   Rolm Gala ; 07/15/2010 23:56:05

## 2010-07-15 NOTE — Procedures (Signed)
 PERFORMED ON - 07/15/2010 14:33:25;   DONE BY - NEEDLE, MARK;  PROCEDURE - PDP RE-EVALUATION;   HEARTRATE: 73;   RESP RATE: 22;   BREATH SOUNDS: DIM T/O;   ADMITTING DIAGNOSIS: PANC. MASS;   HEMOGLOBIN: 14.0;   TRACHEOSTOMY PRESENT: NO;   SOB: {SEE COMMENTS};   PRE-TITR FIO2 OR L/M: 70;   PRE-TITR SAO2: 94;   MD REQUEST #1: OXYGEN;   RCP RECOMMENDATION: #1: O2 PROTOCOL;   INDICATION: #1: O: TO KEEP SA02 >92;   EVALUATION OUTCOME: #1: AS RCP RECOMMENDED;   ADVERSE REACTIONS: NONE;   COMMENT: YES ON SHORT OF BREATH DUE TO PAIN  ELECTRONIC SIGNATURE DERIVED FROM A SINGLE CONTROLLED ACCESS PASSWORD:   NEEDLE, MARK ; 07/15/2010 14:33:25

## 2010-07-15 NOTE — Progress Notes (Signed)
Hypoxia requiring FM oxygen- likely secondary to smoking history, decreased ventilation due to pain. Plan start Toradol, use low continuous rate on PCA rather than bolus narcotics. Aggressive spirometry.

## 2010-07-15 NOTE — Procedures (Signed)
 PERFORMED ON - 07/15/2010 03:46:00;   DONE BY - Dairl Ponder;  PROCEDURE - AERO TRT-HAND HELD;   PROTOCOL DRIVEN: YES-MD INITIATED;   NEURO STATUS: SEDATED;   PRE HEARTRATE: 85;   PRE RESP RATE: 21;   BREATH SOUNDS: CLEAR DIMINISHED BILATERALLY;   PEAK FLOW-PRE: NA;   MEDICATION #1: DUONEB;   MED #1 AMOUNT: 3.0;   MED #1 UNIT: ML;   POST HEARTRATE: 79;   POST RESP RATE: 17;   COUGH EFFORT: NONE;   SUCTION X: NOT DONE;   SPUTUM PRODUCTION: NONE;   CHANGES IN BS: NO CHANGE;   PEAK FLOW- POST: NA;   AERO-ACTIV OUTCOME: CONTINUE PRESENT REGIMEN;   ADVERSE REACTIONS: NONE;   ELECTRONIC SIGNATURE DERIVED FROM A SINGLE CONTROLLED ACCESS PASSWORD:   Dairl Ponder ; 07/15/2010 04:41:52

## 2010-07-15 NOTE — Interdisciplinary (Signed)
[facility] text paged regarding increased O2 demands.  New orders rec'd.

## 2010-07-15 NOTE — Interdisciplinary (Signed)
Per Dr. Orlin Hilding, we will continue to monitor his UOP for now. They do not want to increase IVF at this time due to his respiratory status.

## 2010-07-15 NOTE — Procedures (Signed)
 PERFORMED ON - 07/15/2010 02:05:00;   DONE BY - Dairl Ponder;  PROCEDURE - OXYGEN-HIGH FLOW;   PROTOCOL DRIVEN: YES-MD INITIATED;   OXYGEN CHECK: DONE;   ADVERSE REACTIONS: NONE;   ELECTRONIC SIGNATURE DERIVED FROM A SINGLE CONTROLLED ACCESS PASSWORD:   Dairl Ponder ; 07/15/2010 02:54:34

## 2010-07-15 NOTE — Interdisciplinary (Signed)
Patient re-assessed. Vital signs are stable, however, pt is diaphoretic at this time. Pt is AAOx4, obeys commands, calm & cooperative, however, is slow to respond and drowsy. Pt continues with pain despite increase in PCA with bolus and addition of Toradol, will continue to monitor & reassess pain. NG tube secured in place and not advanced after he pulled on it per MD order-continues to drain dark green bile. Foley in place draining amber clear urine. Incision with slow scant drainage which is outlined-will continue to assess incision & draniage. Pt is refusing to get OOB at this time, states he will attempt to sit in chair after 1300. Will call lift team at that time to assist. Wife is at bedside & supportive. Patient has comfort measures in place and is refusing any additional interventions at this time. Patient has call light and all other requested personal belongings within reach. Will continue to monitor patient for any changes in condition. Will continue plan of care.

## 2010-07-15 NOTE — Progress Notes (Signed)
Progress Note  General Surgery    Patient Name: Shawn Mejia  MRN: [id number] Room#: 2207/2207B    Service: Surgery, [place] [facility] Attending Provider: Aline August., MD    Date: 07/15/2010  Hospital Day:   1 day - Admitted on: 07/14/2010  Post Operative Day(s):  1    SUBJECTIVE: Incisional pain this AM, improved following dilaudid. Desaturations overnight requiring increasing oxygen support. Currently on facemask, FiO2: 75%. Also received albuterol/atrovent neb with some improvement in WOB. He denies dyspnea or SOB currently.    OBJECTIVE:  Vitals signs:  Latest Entry Range (last 24 hours)   Temperature: 99.6 F (37.6 C) Temp  Avg: 98.4 F (36.9 C)  Min: 97.5 F (36.4 C)  Max: 99.6 F (37.6 C)   Blood pressure (BP): 140/71 mmHg BP  Min: 129/88  Max: 147/80   Heart Rate: 78  Pulse  Avg: 70.8   Min: 58   Max: 97    Respirations: 15  Resp  Avg: 14.2   Min: 11   Max: 24    SpO2: 92 % SpO2  Avg: 94.3 %  Min: 90 %  Max: 99 %   Weight - scale: 94.2 kg (207 lb 10.8 oz)  Percentage Weight Change (%): 0 %    Intake/Output    Date 07/14/10 0700 - 07/15/10 0659 07/15/10 0700 - 07/16/10 0659    Time 5409-8119 1500-2259 2300-0659 Total 0700-1459 1500-2259 2300-0659 Total   INTAKE    I.V.   2500   1143.3   951.7 4595                 Total Intake 2500 1143.3 951.7 4595       OUTPUT    Urine   500   550   1000 2050                 Emesis/NG Output       0   250 250                 Blood   50   50     100                 Total Output [phone] 2400       NET I/O     1950 543.3 -298.3 2195                        Diet:NPO    Medications:  Scheduled Meds       . HYDROmorphone  1 mg Once   . enoxaparin  40 mg Daily   . metroNIDAZOLE  500 mg Q8H   . famotidine  20 mg Q12H   . famotidine  20 mg Q12H   . insulin regular  1-10 Units Q6H   . NARCOTIC DRIP (FOR PYXIS)      . metoprolol  5 mg Q6H   . ceFAZolin (ANCEF) IVPB  1,000 mg Q8H   . DISCONTD: heparin  5,000 Units Q8H   . DISCONTD: ceFAZolin (ANCEF) IVPB  1,000 mg Intra-Op Once   .  DISCONTD: metroNIDAZOLE  500 mg Intra-Op Once   . DISCONTD: ceFAZolin (ANCEF) IVPB  1,000 mg Q8H       PRN Meds       . ipratropium-albuterol  3 mL Q4H PRN   . naloxone  0.1 mg Q2 Min PRN   . glucose  4 tablet PRN   . glucose 40%  1 Tube PRN   . dextrose  12.5 g PRN   . glucagon  1 mg Once PRN   . metoclopramide  10 mg Q6H PRN   . DISCONTD: naloxone  0.1 mg Q2 Min PRN   . DISCONTD: HYDROmorphone  0.4 mg Q8 Min PRN   . DISCONTD: fentaNYL  50 mcg Q8 Min PRN   . DISCONTD: fentaNYL  25 mcg Q8 Min PRN   . DISCONTD: ondansetron  4 mg Once PRN   . DISCONTD: diphenhydrAMINE  12.5 mg Once PRN   . DISCONTD: promethazine (PHENERGAN) IVPB  12.5 mg Once PRN       IV Meds       . lactated ringers 100 mL/hr (07/15/10 0415)   . HYDROmorphone           Allergies:  No Known Allergies    Labs:  CBC  Recent Labs   Basename 07/15/10 0430 07/14/10 1245   . WBC 25.9* 22.0*   . HGB 14.3 13.8   . HCT 42.0 41.8   . PLT 252 277   . BAND -- --   . SEG -- --   . LYMPHS -- --   . MONOS -- --      Chemistry  Recent Labs   Basename 07/15/10 0430 07/14/10 1245   . NA 139 140   . K 3.9 4.3   . CL 103 107   . BICARB 27 26   . BUN 10 13   . CREAT 0.77 0.75   . GLU 126* 137*   . [place] 8.1* 7.9*   . MG -- --   . PHOS -- --   . IONCA -- --     Coags  Recent Labs   Basename 07/15/10 0430 07/14/10 1245   . PT 12.3 11.0   . PTT -- --   . INR 1.2 1.0     Glucose  Recent Labs   Basename 07/15/10 0430 07/14/10 1245   . GLU 126* 137*     Imaging: CXR: Findings suggest increased circulating blood volume with moderate to severe interstitial pulmonary edema. Possible right pleural effusion.    Exam:  Neuro: Awake, alert, interactive.  CV: RRR   Pulm: BS clear, diminished bases bilat.  Abd: TTP, mildly distended. Dressing in place with serosang drainage on inferior portion of incision.  Ext: Warm, well-perfused    ASSESSMENT / PLAN: Shawn Mejia is a 59 year old male s/p Whipple  Neuro: Continue PCA for pain  CV: HDS, monitor  Pulm: Hypoxemia, stable on high flow O2. 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  - MIVF@70   Heme/ID: AF, WBC elevated likely post-op stress response, will follow for s/s of infection.  Renal: good UOP, patient is auto-diuresing intra-op fluids. Will monitor, possible diuretics if continued fluid overload without sufficient auto-diuresis  Endo: BS well-controlled  - Continue SSI  Ppx: famotidine, lovenox    Tommye Standard MS4      I have read and edited the above note to correspond to my findings, assessment, and plan. I agree with all of the above.    Jerolyn Center PGY1

## 2010-07-15 NOTE — Interdisciplinary (Signed)
Received hand-off report from day RN denise.  Patient alert, sitting up in bed with wife at bedside.  Oriented to new nursing staff, white board updated, PCA double checked.  Patient wearing high flow mask at 15 L.  Bed in low/locked position, bed alarm turned on.  PCA CO2 monitor tubing changed due to occlusion. VSS, no needs at this time.

## 2010-07-15 NOTE — Plan of Care (Signed)
Problem: Airway Clearance - Ineffective  Goal: Cough is effective and produces thin mucus  Outcome: Not Met  Weak productive cough of thick blood-tinged sputum  Goal: Lungs are clear to auscultation  If hypoventilation is present, implement Breathing Pattern - Ineffective, plan of care.   Outcome: Not Met  Diminished in bases, shallow breathing  Goal: Respiratory rate, depth and rhythm to baseline  Outcome: Not Met  Increasing O2 demands this shift, unable to perform incentive spirometer exercises due to pain and drowsiness.    Problem: Pain - Acute  Goal: Control of acute pain  Outcome: Not Met  Dilaudid PCA with increased pain with any activity.  Goal: Response to pain management methods  Outcome: Not Met  Needs frequent reminders to push PCA.    Problem: Skin Integrity -Impaired:secondary to high risk for breakdown, cellulitis, pressure ulcer (admission or hospital acquired), incontinence, surgical wound/incision, chronic skin disease, chronic wound  Goal: Absence of infection signs and symptoms  Outcome: Not Met  Low grade fever, gradually increasing throughout shift.

## 2010-07-15 NOTE — Interdisciplinary (Signed)
Pt pulled on his NG tube 1030, green surgery resident notified and came to bedside to assess. They will speak with Dr. Jetta Lout to decide if the tube will be advanced to place or discontinued. Pt appears to be somewhat more confused than initial assessment. He answers questions appropriately, AAOx4, follows commands but is slow to respond and appears sleepy/drowsy. Pt states his pain is not better controlled since PCA dose was changed earlier in the morning. They will advise if any new pain meds will be ordered and will follow-up with regard to the NG tube. Wife is at bedside.  He remains on high flow mask at 15L, FiO2 75%, sats well while in place, however, desats quickly to mid 80s when he takes it off. Will continue to monitor patient closely for any changes and continue with current POC.

## 2010-07-15 NOTE — Interdisciplinary (Signed)
RN trigger acknowledged re; swallowing/chewing difficulty; pt with pancreatic mass, s/p Whipple on 9/22, POD#1, currently NPO, if chewing/swallowing difficulty is an issue, would consult speech prior to PO diet; will assess nutrition status by HD#4  C. Rayetta Pigg RD, CNSC, pager [phone]

## 2010-07-15 NOTE — Interdisciplinary (Signed)
Report off to [place], [place]. No acute changes at this time. Patient stable. Patient denies any further needs.  Wife at bedside. PCA rate verified and cleared.

## 2010-07-15 NOTE — Procedures (Signed)
 PERFORMED ON - 07/15/2010 07:04:02;   DONE BY - NEEDLE, MARK;  PROCEDURE - OXYGEN-HIGH FLOW;   PROTOCOL DRIVEN: YES-MD INITIATED;   OXYGEN CHECK: DONE;   ADVERSE REACTIONS: NONE;   ELECTRONIC SIGNATURE DERIVED FROM A SINGLE CONTROLLED ACCESS PASSWORD:   NEEDLE, MARK ; 07/15/2010 08:00:02

## 2010-07-15 NOTE — Plan of Care (Signed)
Problem: Airway Clearance - Ineffective  Goal: Cough is effective and produces thin mucus  Outcome: Not Met  Pt with occasional productive cough with thick blood-tinged sputum. MD made aware, no orders for sputum culture.   Goal: Lungs are clear to auscultation  If hypoventilation is present, implement Breathing Pattern - Ineffective, plan of care.   Outcome: Not Met  Pt with diminished breath sounds at bases with occasional fine crackles noted. On high flow O2 75%, 15L continuously and pt desats to mid 80s when off, DOE noted. Respiratory following patient.   Goal: Respiratory rate, depth and rhythm to baseline  Outcome: Not Met  Pt with DOE, however, at rest respirations are at baseline, however, desats easily/quickly when off O2    Problem: Pain - Acute  Goal: Control of acute pain  Outcome: Not Met  Pt continues to c/o acute pain to abdomen despite increasing PCA with continuous dose and the addition of Toradol. Pt is not using PCA appropriatly, however, and needs constant reminders that he can push button for dose every 10 minutes. Wife is at bedside and is reminding patient.     Problem: Skin Integrity -Impaired:secondary to high risk for breakdown, cellulitis, pressure ulcer (admission or hospital acquired), incontinence, surgical wound/incision, chronic skin disease, chronic wound  Goal: Absence of infection signs and symptoms  Outcome: Not Met  Pt with elevated WBC on am labs today, diaphoresis noted at rest, however, pt has been afebrile. Will continue to monitor  Goal: Skin integrity intact: Less than or equal to 18 Braden score, or other non-pressure related skin conditions  Outcome: Not Met  Pt at risk for skin breakdown with moist skin with diaphoresis and reluctance to turn Q2H. Educated patient on need to shift weight to decrease risk for skin breakdown. Wife has been helpful in turning patient.     Problem: Discharge Planning  Goal: Participation in care planning  No discharge plans at this time.

## 2010-07-15 NOTE — Interdisciplinary (Signed)
Received pt from [place], [place]. Pt assessed and in stable condition, VSS. States pain is 4/10 with PCA and is tolerable. Midline abdominal incision with drainage to gauze dressing, increasing in size slightly, outlined and will monitor closely. Foley catheter draining clear, yellow urine. NG Tube to continuous low suction. Wife at bedside. Pt has comfort measures in place, call bell within reach. Will continue with current POC.  PCA rate verified and cleared.

## 2010-07-16 ENCOUNTER — Encounter (HOSPITAL_COMMUNITY): Payer: Self-pay | Admitting: Surgical Oncology

## 2010-07-16 MED ORDER — MORPHINE SULFATE 2 MG/ML IJ SOLN
1.0000 mg | INTRAMUSCULAR | Status: DC | PRN
Start: 2010-07-16 — End: 2010-07-20
  Administered 2010-07-16: 1 mg via INTRAVENOUS
  Administered 2010-07-16: 2 mg via INTRAVENOUS
  Administered 2010-07-17 – 2010-07-20 (×6): 1 mg via INTRAVENOUS
  Filled 2010-07-16 (×9): qty 1

## 2010-07-16 MED ORDER — POTASSIUM PHOSPHATE 10 MEQ/100 ML D5W
10.00 meq | Status: AC
Start: 2010-07-16 — End: 2010-07-17
  Administered 2010-07-16 – 2010-07-17 (×3): 10 meq via INTRAVENOUS
  Filled 2010-07-16 (×4): qty 100

## 2010-07-16 MED ORDER — HALOPERIDOL LACTATE 5 MG/ML IJ SOLN
2.5000 mg | Freq: Four times a day (QID) | INTRAMUSCULAR | Status: DC | PRN
Start: 2010-07-16 — End: 2010-07-21

## 2010-07-16 NOTE — Interdisciplinary (Signed)
OR tubing changed out on both IVs, new dressings applied.  Both IVs flushed

## 2010-07-16 NOTE — Procedures (Signed)
 PERFORMED ON - 07/16/2010 02:50:39;   DONE BY - JARJIS, STEVE S;  PROCEDURE - OXYGEN-HIGH FLOW;   PROTOCOL DRIVEN: YES-MD INITIATED;   OXYGEN CHECK: DONE;   ADVERSE REACTIONS: NONE;   ELECTRONIC SIGNATURE DERIVED FROM A SINGLE CONTROLLED ACCESS PASSWORD:   Rolm Gala ; 07/16/2010 02:57:39

## 2010-07-16 NOTE — Procedures (Signed)
 PERFORMED ON - 07/16/2010 00:55:14;   DONE BY - DENEAR, CATHY A;  PROCEDURE - STANDBY;   ADVERSE REACTIONS: NONE;   COMMENT: GETTING EQUIP TO SET UP HIGH FLOW; ALSO BLENDER FOR PT  ELECTRONIC SIGNATURE DERIVED FROM A SINGLE CONTROLLED ACCESS PASSWORD:   Aneta Mins A ; 07/16/2010 04:57:14

## 2010-07-16 NOTE — Interdisciplinary (Signed)
Patient was to be transferred to ICU, orders put into epic.  Dr. Jetta Lout decided to keep the patient in the IMU, another set of transfer orders put into epic.  Dr. Irene Limbo put in order for restraint however order is no longer visible due to transfers.  Restraint documentation completed.Marland Kitchen

## 2010-07-16 NOTE — Procedures (Signed)
 PERFORMED ON - 07/16/2010 02:40:09;   DONE BY - JARJIS, STEVE S;  PROCEDURE - STANDBY;   ADVERSE REACTIONS: NONE;   COMMENT: PT COMPLAINING HUMD TOO HIGH, AIR TOO HOT, HUMD WAS TURNED OFF  ELECTRONIC SIGNATURE DERIVED FROM A SINGLE CONTROLLED ACCESS PASSWORD:   Rolm Gala ; 07/16/2010 02:57:09

## 2010-07-16 NOTE — Procedures (Incomplete)
Shawn Mejia is a 59 year old male patient.  No diagnosis found.  Past Medical History   Diagnosis Date   . 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 Basal cell carcinoma 2009       Blood pressure 124/75, pulse 87, temperature 98.7 F (37.1 C), resp. rate 18, height 5' 10.5" (1.791 m), weight 95.7 kg (210 lb 15.7 oz), SpO2 99.00%.    Procedures    Maryelizabeth Kaufmann  07/16/2010

## 2010-07-16 NOTE — Procedures (Signed)
 PERFORMED ON - 07/16/2010 14:00:00;   DONE BY - CUTLER, ELOISA R;  PROCEDURE - PDP RE-EVALUATION;   HEARTRATE: 94;   RESP RATE: 24;   BREATH SOUNDS: DIM T/O;   ADMITTING DIAGNOSIS: PANC. MASS;   HEMOGLOBIN: 13.3;   CLUBBING PRESENT: NO;   TRACHEOSTOMY PRESENT: NO;   SOB: {SEE COMMENTS};   CHEST EXCURSION: SYMMETRICAL;   PRE-TITR FIO2 OR L/M: 10;   PRE-TITR SAO2: 99;   TITRATED FIO2 OR L/M: 7;   POST TITRATION SAO2: 97;   MD REQUEST #1: OXYGEN;   RCP RECOMMENDATION: #1: O2 PROTOCOL;   INDICATION: #1: O: TO KEEP SA02 >92;   EVALUATION OUTCOME: #1: AS RCP RECOMMENDED;   ADVERSE REACTIONS: NONE;   ELECTRONIC SIGNATURE DERIVED FROM A SINGLE CONTROLLED ACCESS PASSWORD:   Levy Pupa R ; 07/16/2010 15:06:02

## 2010-07-16 NOTE — Procedures (Signed)
 PERFORMED ON - 07/16/2010 10:25:00;   DONE BY - CUTLER, ELOISA R;  PROCEDURE - OXYGEN-LOW FLOW;   PROTOCOL DRIVEN: YES-MD INITIATED;   OXYGEN CHECK: DONE;   ADVERSE REACTIONS: NONE;   ELECTRONIC SIGNATURE DERIVED FROM A SINGLE CONTROLLED ACCESS PASSWORD:   Levy Pupa R ; 07/16/2010 15:05:44

## 2010-07-16 NOTE — Plan of Care (Signed)
Problem: Infection  Goal: Absence of infection signs and symptoms  Outcome: Not Met  Patient's WBC rising, last one this am was 27.6

## 2010-07-16 NOTE — Procedures (Signed)
 PERFORMED ON - 07/16/2010 04:20:52;   DONE BY - DENEAR, CATHY A;  PROCEDURE - STANDBY;   ADVERSE REACTIONS: NONE;   COMMENT: SEE PT; HE DOES NOT LIKE TOO MUCH HUMIDITY; RN PLACED ON NRB  ELECTRONIC SIGNATURE DERIVED FROM A SINGLE CONTROLLED ACCESS PASSWORD:   Aneta Mins A ; 07/16/2010 05:27:52

## 2010-07-16 NOTE — Plan of Care (Signed)
Problem: Airway Clearance - Ineffective  Goal: Cough is effective and produces thin mucus  Outcome: Not Met  Pt continues with thick mucus but no longer blood tinged, yellow in color   Goal: Lungs are clear to auscultation  If hypoventilation is present, implement Breathing Pattern - Ineffective, plan of care.   Outcome: Not Met  Diminished at the bases with scattered fine crackles noted.   Goal: Respiratory rate, depth and rhythm to baseline  Outcome: Met  Pt's breathing is even and non-labored, O2 sats are WNL with oximizer-started shift on 10L O2 and now down to 7L with O2 sats 96%    Problem: Pain - Acute  Goal: Control of acute pain  Outcome: Met  Pt continues with pain now that PCA has been discontinued. Pt is receiving 15 mg IV Toradol with 1 mg IV Morphine Q2H for breakthrough pain.     Problem: Skin Integrity -Impaired:secondary to high risk for breakdown, cellulitis, pressure ulcer (admission or hospital acquired), incontinence, surgical wound/incision, chronic skin disease, chronic wound  Goal: Absence of infection signs and symptoms  Outcome: Met  Pt is at risk for skin breakdown due to NPO status, moist due to diaphoresis and immobility. Turned and repositioned frequently, pt sitting in chair now and ambulating. Will maintain clean/dry skin with barrier cream applied to sacrum and gluteus with pillow support while in bed    Problem: Falls - Risk of  Goal: Knowledge of fall prevention  Outcome: Met  Bed low, brakes on, call bell within reach, non-skin socks on, bedside table within reach. Patient educated regarding fall prevention and voices understanding.  Bed alarm on. Has been educated to call for assistance to reach things in bed, to call for turning assist, to call for toileting or transfer to chair.  Patient understands they are to not get out of bed without assistance or to ambulate alone.       Problem: Infection  Goal: Absence of infection signs and symptoms  Outcome: Not Met  Pt has been  afebrile, however, WBC are trending up. Will continue to monitor

## 2010-07-16 NOTE — Procedures (Signed)
 PERFORMED ON - 07/15/2010 23:15:02;   DONE BY - JARJIS, STEVE S;  PROCEDURE - STANDBY;   ADVERSE REACTIONS: NONE;   COMMENT: PT BECAME COMBATIVE AND HELD PT DOWN, WAITING FOR SECURITY  ELECTRONIC SIGNATURE DERIVED FROM A SINGLE CONTROLLED ACCESS PASSWORD:   Rolm Gala ; 07/15/2010 23:57:02

## 2010-07-16 NOTE — Progress Notes (Signed)
 Surgery Progress Note    Patient Name: Shawn Mejia MRN: [id number] Room#: 2206/2206    Date: 07/16/2010  Hospital Day:   2 days - Admitted on: 07/14/2010  Post Operative Day(s): 2    SUBJECTIVE: had bout of delirium requiring restraints, pulled NG tube out; narcotics were stopped and mental status improved this morning. Decreasing O2 requirement, now on 10L NC. Reports feeling much better and has no recollection of events overnight.    OBJECTIVE:     Vitals signs:  Latest entry:   Temperature: 98.7 F (37.1 C)  Heart Rate: 87   Respirations: 18   Blood pressure (BP): 124/75 mmHg  SpO2: 99 %  Weight - scale: 95.7 kg (210 lb 15.7 oz)  Percentage Weight Change (%): 1.59 %    RANGE(last 24 hrs):  BP  Min: 112/83  Max: 124/75  Temp  Avg: 98.9 F (37.2 C)  Min: 98.4 F (36.9 C)  Max: 99.9 F (37.7 C)  Pulse  Avg: 94.2   Min: 87   Max: 105   Resp  Avg: 14.8   Min: 12   Max: 18   SpO2  Avg: 96.4 %  Min: 91 %  Max: 100 %      Intake/Output    Date 07/15/10 0700 - 07/16/10 0659 07/16/10 0700 - 07/17/10 0659    Time 8119-1478 1500-2259 2300-0659 Total 0700-1459 1500-2259 2300-0659 Total   INTAKE    I.V.   348.3   842.2   1367.5 2558                 Total Intake 348.3 842.2 1367.5 2558       OUTPUT    Urine   450   150   550 1150                 Emesis/NG Output       250   75 325                 Total Output 450 [phone]       NET I/O     -101.7 442.2 742.5 1083                          Diet  NPO      Labs:  CBC  Recent Labs   Basename 07/16/10 0145 07/15/10 0430   . WBC 27.6* 25.9*   . HGB 13.3* 14.3   . HCT 39.3* 42.0   . PLT 220 252   . BAND -- 5   . SEG 84* 82*   . LYMPHS 8* 10*   . MONOS 7 3*        Chemistry  Recent Labs   [facility] 07/16/10 0145 07/15/10 0430   . NA 138 139   . K 3.9 3.9   . CL 103 103   . BICARB 28 27   . BUN 12 10   . CREAT 0.77 0.77   . GLU 118* 126*   .  7.9* 8.1*   . MG 1.8 --   . PHOS 1.3* --   . IONCA -- --       Coags  Recent Labs   Basename 07/16/10 0145 07/15/10 0430   . PT 16.7* 12.3   .  PTT -- --   . INR 1.6 1.2       Physical Exam:  General Appearance: a&ox3, NAD, cooperative  Heart:  RRR,  no m/g/r  Lungs: ctab, nonlabored, on 10L NC  Abdomen: soft, appropriately ttp around incision, mild distension; incision c/d/i  Extremities:  Wwp, 2+ periph pulses    Medications:  Current facility-administered medications   Medication   . haloperidol lactate (HALDOL) injection 2.5 mg   . ipratropium-albuterol (DUO-NEB) 0.5-2.5 MG/3ML nebulizer solution 3 mL   . dextrose-sodium chloride 5%-0.45% infusion    . albuterol (PROVENTIL HFA) 108 (90 BASE) MCG/ACT inhaler 2-20 puff   . ketorolac (TORADOL) injection 30 mg   . sodium chloride 0.9 % bolus 500 mL   . DISCONTD: albuterol (PROVENTIL HFA) 108 (90 BASE) MCG/ACT inhaler 2-20 puff   . DISCONTD: LORazepam (ATIVAN) 2 MG/ML injection  - ADS OVERRIDE   . naloxone (NARCAN) injection 0.1 mg   . glucose chewable tablet 16 g   . glucose 40% oral gel 1 Tube   . dextrose 50 % solution 12.5 g   . glucagon (GLUCAGON) injection 1 mg   . enoxaparin (LOVENOX) injection 40 mg   . famotidine (PEPCID) IVPB 20 mg   . famotidine (PEPCID) tablet 20 mg   . metoclopramide (REGLAN) injection 10 mg   . insulin regular (HUMULIN,NOVOLIN) injection 1-10 Units   . NARCOTIC DRIP (FOR PYXIS)  - ADS OVERRIDE   . metoprolol (LOPRESSOR) injection 5 mg   . DISCONTD: HYDROmorphone (DILAUDID) 0.2 MG/ML PCA         ASSESSMENT / PLAN:   59 year old male with s/p whipple. POD2. Delirium o/n while on narcotics, has been improving in O2 requirements. Good UOP.    - okay for small doses of prn morphine IV, cont toradol  - MIVF @70   - PA/lat CXR today  - wean O2 per protocol, SaO2>92%  - replete lytes    D/w Dr. Arrie Eastern, PGY1

## 2010-07-16 NOTE — Procedures (Signed)
 PERFORMED ON - 07/16/2010 01:30:56;   DONE BY - DENEAR, CATHY A;  PROCEDURE - STANDBY;   ADVERSE REACTIONS: NONE;   COMMENT: GETTING EQUIP TO SET UP HIGH FLOW; ALSO BLENDER FOR PT  ELECTRONIC SIGNATURE DERIVED FROM A SINGLE CONTROLLED ACCESS PASSWORD:   Aneta Mins A ; 07/16/2010 04:57:56

## 2010-07-16 NOTE — Interdisciplinary (Signed)
Patient currently alert and oriented to person, place, and situation.  Pleasant affect, has no memory of combative behavior during the night.  Patient currently calm and cooperative, wife at bedside.  Resident in speaking with patient at this time. Gave hand-off report to day Kary Kos, patient stable.  No needs at this time.

## 2010-07-16 NOTE — Procedures (Signed)
 PERFORMED ON - 07/16/2010 01:10:41;   DONE BY - DENEAR, CATHY A;  PROCEDURE - STANDBY;   ADVERSE REACTIONS: NONE;   COMMENT: GETTING EQUIP TO SET UP HIGH FLOW; ALSO BLENDER FOR PT  ELECTRONIC SIGNATURE DERIVED FROM A SINGLE CONTROLLED ACCESS PASSWORD:   Aneta Mins A ; 07/16/2010 04:57:41

## 2010-07-16 NOTE — Interdisciplinary (Signed)
Report off to [place], Charity fundraiser. No acute changes at this time. Patient stable. Patient denies any further needs.

## 2010-07-16 NOTE — Interdisciplinary (Signed)
Patient is pleasant and cooperative at this time, does not remember anything about combative episode earlier this evening.  Allow charge RN to pull labs, now on high flow blender and is not trying to pull off mask. Wife awake at bedside.

## 2010-07-16 NOTE — Plan of Care (Signed)
Problem: Airway Clearance - Ineffective  Goal: Lungs are clear to auscultation  If hypoventilation is present, implement Breathing Pattern - Ineffective, plan of care.   Outcome: Not Met  Patient is wearing high flow mask at 15 L, 75% at start of shift.  Increased during episode of agitation, back down to 75%.  Breath sounds diminished throughout  Goal: Respiratory rate, depth and rhythm to baseline  Outcome: Not Met  Patient still on high flow mask, taking shallow breaths.  Lung sounds diminished throughout.    Problem: Pain - Acute  Goal: Communication of presence of pain  Outcome: Met  Patient is able to convey his pain level using the verbal pain scale.  PCA dilaudid discontinued for LOC issues (see note), toradol Q 6 hours.    Problem: Skin Integrity -Impaired:secondary to high risk for breakdown, cellulitis, pressure ulcer (admission or hospital acquired), incontinence, surgical wound/incision, chronic skin disease, chronic wound  Goal: Skin integrity intact: Less than or equal to 18 Braden score, or other non-pressure related skin conditions  Outcome: Met  Patient's sacral area is pink, blanchable.  Instructed patient and wife at bedside that it is important to turn side to side so as to offload pressure points and bony prominences. Patient and wife verbalized understanding.  Turned patient onto right side.  Will continue turning every 2 hours.

## 2010-07-16 NOTE — Interdisciplinary (Signed)
Received pt from [place], [place]. Pt assessed and in stable condition, VSS, AAOx3, wife at bedside. C/o 4/10 pain to abdomen, however, unable to give pain meds as pt's PCA has been discontinued and is getting Toradol Q6H, not due until 12pm. MD assessed patient and changed abdominal dressing. Incision is well approximated with staples, no drainage noted.  Pt has comfort measures in place, call bell within reach. Will continue with current POC.

## 2010-07-16 NOTE — Interdisciplinary (Signed)
Patient to remain in IMU, being transferred to private room 206.  Patient has calmed down considerably and is now just confused as to what is going on.  Will continue trying to orient the patient.  Patient is still refusing labs and 12 lead ekg.

## 2010-07-16 NOTE — Procedures (Signed)
 PERFORMED ON - 07/15/2010 01:30:36;   DONE BY - DENEAR, CATHY A;  PROCEDURE - EQUIP CHANGE-AERO;   AERO EQUIP CHANGE: DONE;   ADVERSE REACTIONS: NONE;   ELECTRONIC SIGNATURE DERIVED FROM A SINGLE CONTROLLED ACCESS PASSWORD:   Aneta Mins A ; 07/16/2010 04:53:36

## 2010-07-16 NOTE — Procedures (Signed)
 PERFORMED ON - 07/15/2010 19:35:50;   DONE BY - DENEAR, CATHY A;  PROCEDURE - OXYGEN-HIGH FLOW;   PROTOCOL DRIVEN: YES-MD INITIATED;   OXYGEN CHECK: DONE;   ADVERSE REACTIONS: NONE;   ELECTRONIC SIGNATURE DERIVED FROM A SINGLE CONTROLLED ACCESS PASSWORD:   Aneta Mins A ; 07/16/2010 00:14:50

## 2010-07-16 NOTE — Progress Notes (Signed)
Calmer and oriented this AM with decreased oxygen requirement. Continue aggressive pulmonary toilet- NPO. Haldol for agitation, only small amounts of narcotic.

## 2010-07-16 NOTE — Interdisciplinary (Signed)
Pt ambulated around unit once, approx 200 ft. Tolerated well, O2 sats remained 97% on 7L O2. C/o increased pain, 7/10 to mid abdomen. Pt understands he has morphine available for breakthrough pain every 2 hours but he declines at this time. Pt is sitting in chair at bedside. Encouraged patient to use IS 10x every hour. He has been using it upon prompting but needs frequent reminders. Gets up to 1000. Continues to cough up thick yellow sputum.  Patient has comfort measures in place and is refusing any additional interventions at this time. Patient has call light and all other requested personal belongings within reach. Will continue to monitor patient for any changes in condition. Will continue plan of care.

## 2010-07-16 NOTE — Interdisciplinary (Signed)
Patient started trying to pull off high flow mask.  After this RN and charge RN instructed and educated patient as to why keeping the mask on was important, patient continued to pull at mask and started pulling at NG tube.  This rn and charge rn tried to control patients arms in order to keep him from pulling out ng tube however patient started spitting directly at both RNs.  Called for help at this time, multiple RNs came into room.  At this time, patient continued spitting and started kicking, punching, and grabbing at any person close to him including his wife and RNs.  Security was called, restraints were placed, Dr. Irene Limbo notified.  Patient was able to pull out NG tube completely.  At this time patient started laughing and saying "psych" multiple times.  Patient's O2 saturation dropped to 70% as he would not keep his high flow mask on, mask replaced after initial episode and O2 sat returned to 98%.  Nursing supervisor in to see patient and speak with wife.

## 2010-07-16 NOTE — Procedures (Signed)
 PERFORMED ON - 07/16/2010 01:45:05;   DONE BY - DENEAR, CATHY Mejia;  PROCEDURE - STANDBY;   ADVERSE REACTIONS: NONE;   COMMENT: GETTING EQUIP TO SET UP HIGH FLOW; ALSO BLENDER FOR PT  ELECTRONIC SIGNATURE DERIVED FROM Mejia SINGLE CONTROLLED ACCESS PASSWORD:   Shawn Mejia ; 07/16/2010 04:58:13

## 2010-07-16 NOTE — Interdisciplinary (Signed)
Patient now in room 206 with COA.  Wife remains at bedside.  VSS, heart rate returned to 90s.  Temp of 99.9 F oral.  4 point restraints removed.  Dr. Jetta Lout in to see patient, plan is to use Haldol for any future episodes of aggression/combative behavior. Patient remains confused as to what happened during combative episode, oriented to person and place currently.

## 2010-07-16 NOTE — Interdisciplinary (Signed)
Nursing supervisor and Dr. Irene Limbo continuing to work with this RN, Consulting civil engineer, and patient's Wife.  Patient is minimally responsive verbally.  States he does not want to talk to anyone except for Dr. Jetta Lout. ABG drawn however patient  refusing to let us draw further labs and refusing 12 lead EKG.  Patient remains in 4 point soft restraints, order input by Dr. Irene Limbo.  Patient is becoming somewhat paranoid as he demands the curtains to be open and is mistrustful of anyone in the room "being there to help him".  Patient still requesting to speak with Dr. Jetta Lout.  500 ml NS bolus given.  Patient potentially being transferred to ICU.

## 2010-07-16 NOTE — Procedures (Signed)
 PERFORMED ON - 07/15/2010 23:30:32;   DONE BY - JARJIS, STEVE S;  PROCEDURE - STANDBY;   ADVERSE REACTIONS: NONE;   COMMENT: PT SPITTING, HITTING AND THRASHING AROUND, SECURITY ARRIVAL  ELECTRONIC SIGNATURE DERIVED FROM A SINGLE CONTROLLED ACCESS PASSWORD:   Rolm Gala ; 07/15/2010 23:57:32

## 2010-07-16 NOTE — Procedures (Signed)
 PERFORMED ON - 07/16/2010 01:25:48;   DONE BY - DENEAR, CATHY A;  PROCEDURE - STANDBY;   ADVERSE REACTIONS: NONE;   COMMENT: GETTING EQUIP TO SET UP HIGH FLOW; ALSO BLENDER FOR PT  ELECTRONIC SIGNATURE DERIVED FROM A SINGLE CONTROLLED ACCESS PASSWORD:   Aneta Mins A ; 07/16/2010 04:57:48

## 2010-07-16 NOTE — Interdisciplinary (Signed)
Returned from chest x-ray. Patient tolerated well, was able to sit OOB for about an hour and ambulate to wheelchair. Pt was monitored and returned to bed with pain 6/10. 1mg  IV morphine ordered for breakthrough pain-will give at this time. Using IS with goal of 1500, getting up to 1000. Coughing up yellow thick sputum. He wishes to stay in bed until the afternoon at which time he will ambulate in the hall. Wife at bedside.

## 2010-07-17 MED ORDER — FUROSEMIDE 10 MG/ML IJ SOLN
20.00 mg | Freq: Once | INTRAMUSCULAR | Status: AC
Start: 2010-07-17 — End: 2010-07-17
  Administered 2010-07-17: 20 mg via INTRAVENOUS
  Filled 2010-07-17: qty 2

## 2010-07-17 MED ORDER — POTASSIUM PHOSPHATE 10 MEQ/100 ML D5W
10.00 meq | Status: AC
Start: 2010-07-17 — End: 2010-07-17
  Administered 2010-07-17: 10 meq via INTRAVENOUS
  Filled 2010-07-17: qty 100

## 2010-07-17 NOTE — Interdisciplinary (Signed)
Report off to [employer]. No acute changes at this time. Patient stable. Patient denies any further needs.

## 2010-07-17 NOTE — Interdisciplinary (Signed)
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   Ht/Wt: Height: 5' 10.5" (179.1 cm)   Weight - scale: 95.7 kg (210 lb 15.7 oz)     %IBW=  122% of 172#  IBW  Body mass index is 29.84 kg/(m^2). overweight  Diet Rx: NPO  PO intake: NPO  GI: distended abdomen, hypoactive bowel sounds, no flatus.  Previously with Ng tube to suction, however pt pulled out.  NG not replaced at this time.    I/O: 1040/825 (+215)    Labs:Lab Results   Component Value Date    NA 141 07/17/2010    K 3.7 07/17/2010    CL 105 07/17/2010    BICARB 25 07/17/2010    BUN 15 07/17/2010    CREAT 0.69 07/17/2010    GLU 141  high 07/17/2010    [place] 8.2 low 07/17/2010    ALB 3.9 07/11/2010    WBC 18.1 07/17/2010    PHOS 2.0 low 07/17/2010    MG 1.9 07/17/2010     FSBG 141 fairly controlled  HgbA1c = 6.2 high.        Meds:Current facility-administered medications   Medication   . furosemide (LASIX) injection 20 mg   . potassium PHOSphate 10 MEQ/100ML IVPB 10 mEq   . haloperidol lactate (HALDOL) injection 2.5 mg   . morphine injection 1 mg   . potassium PHOSphate 10 MEQ/100ML IVPB 10 mEq   . ipratropium-albuterol (DUO-NEB) 0.5-2.5 MG/3ML nebulizer solution 3 mL   . dextrose-sodium chloride 5%-0.45% infusion    . albuterol (PROVENTIL HFA) 108 (90 BASE) MCG/ACT inhaler 2-20 puff   . ketorolac (TORADOL) injection 30 mg   . naloxone (NARCAN) injection 0.1 mg   . glucose chewable tablet 16 g   . glucose 40% oral gel 1 Tube   . dextrose 50 % solution 12.5 g   . glucagon (GLUCAGON) injection 1 mg   . enoxaparin (LOVENOX) injection 40 mg   . famotidine (PEPCID) IVPB 20 mg   . famotidine (PEPCID) tablet 20 mg   . metoclopramide (REGLAN) injection 10 mg   . insulin regular (HUMULIN,NOVOLIN) injection 1-10 Units   . metoprolol (LOPRESSOR) injection 5 mg       PER MD NOTES:  Pt is a 59 year old male with  s/p whipple. POD3. Pulmonary status improving. Pain control adequate with minimal narcotics.      Attempted to visit pt x2 attempts today, pt was occupied with nursing at times of visit.  Obtained information from nursing and MD charts.  Per reports, pt had bouts of delirium requiring restraints.  Pt had pulled out his Ng tube which was connected to suction.  NG tube has not replaced.  Pt remains npo post-op Whipple Procedure.          Est Needs:   1900-2375Kcals (20-25Kcal/kg),  78-117gm protein (1-1.5gm/kg IBW)  Fluid Needs: 1.9-2.4L/day (40ml/kcal)  D: Pt with altered GI fxn r/t medical condition AEB post-op GI surgery, remains npo  I. Goal: Pt to receive some type of nutrition and meet nutrition needs.      PLANS/RECOMMENDATIONS:   1.  Diet Advancement per MD.  Suggest CARB LIMITED DIET goal.      2.  If anticipate for pt to remain NPO/CL >6days, suggest starting at least PPN for nutrition support:  D10%, AA4.5% @  5ml/hr + lipids/day.  Will provide 1477 calories, 86g protein.    If TPN needed then: D15%, AA5% @ 81ml/hr + lipids/day.  Will provide 1843 calories, 96g protein (2.08 mg dextrose/kg/min GIR, 95:1 NPC:N ratio).      3.  Check PREALBUMIN & TRIGLYCERIDES weekly if on PN.      4.  Replace electrolytes as needed.        M/E:  Disposition: pending medical course.  Education: Whipple Diet instruction prior to D/C.    Plans relayed to care team.  RD will monitor  intake/tolerance/adequacy, labs, wt. and follow up per dept. policy.  Tenna Delaine, RD pgr# [phone]

## 2010-07-17 NOTE — Procedures (Signed)
 PERFORMED ON - 07/17/2010 03:05:11;   DONE BY - Kerin Salen, ELVIRA;  PROCEDURE - OXYGEN-LOW FLOW;   PROTOCOL DRIVEN: YES-MD INITIATED;   OXYGEN CHECK: DONE;   ADVERSE REACTIONS: NONE;   ELECTRONIC SIGNATURE DERIVED FROM A SINGLE CONTROLLED ACCESS PASSWORD:   Fabian Sharp ; 07/17/2010 03:47:11

## 2010-07-17 NOTE — Plan of Care (Signed)
Problem: Airway Clearance - Ineffective  Goal: Lungs are clear to auscultation  If hypoventilation is present, implement Breathing Pattern - Ineffective, plan of care.   Outcome: Met  Pt using incentive spirometry q4 hours. Pt able to expectorate sputum with cough.     Problem: Pain - Acute  Goal: Communication of presence of pain  Outcome: Not Met  Pt having pain unresolved with PRN and scheduled doses. Pt expressed relief with comfort measures and with using splinting technique while coughing.   Goal: Control of acute pain  Outcome: Not Met  Receiving pain meds as scheduled.     Problem: Pain - Chronic  Goal: Reduction in pain sensation  Outcome: Not Met  Receiving PRN doses as available.     Problem: Discharge Planning  Goal: Participation in care planning  Outcome: Met  Intervention: Patient/family participation promotion  Spoke with wife at length concerning home care needs.

## 2010-07-17 NOTE — Interdisciplinary (Signed)
Report received and care assumed.  Patient resting comfortably without distress.  Monitor NSR without ectopic.  IVF to infuse at 70cc/hr.  Remains NPO and has not passed flatus.  Abd semi firm with sparce bowel sounds.  Foley to gravity with qs output.

## 2010-07-17 NOTE — Plan of Care (Signed)
Problem: Airway Clearance - Ineffective  Goal: Cough is effective and produces thin mucus  Outcome: Met  Patient has productive strong cough and uses the IS throughout the day.    Problem: Pain - Acute  Goal: Communication of presence of pain  Outcome: Met  Medicated with MS X1 at present and has round the clock of tordol with fair pain control.    Problem: Falls - Risk of  Goal: Absence of falls  Outcome: Met  Patient has remained in safe enviornment.    Problem: Discharge Planning  Goal: Able to perform ADL- independently or with minimal assist  Outcome: Not Met  Patient still relies on staff and wife to do most of the work.  Although he knows and helps when he is able to.

## 2010-07-17 NOTE — Plan of Care (Signed)
Problem: Falls - Risk of  Goal: Absence of falls  Outcome: Met  Pt ambulatory with assistance. Fall precautions in place, pt alert and oriented, verbalizes and demonstrates understanding to call for assistance prior to getting OOB.

## 2010-07-17 NOTE — Interdisciplinary (Signed)
 Patient re-assessed. Vital signs are stable. Patient assessment unchanged. Patient stable. Patient denies pain at this time. Patient has comfort measures in place and is refusing any additional interventions at this time. Patient has call light and all other requested personal belongings within reach. Will continue to monitor patient for any changes in condition. Will continue plan of care.

## 2010-07-17 NOTE — Procedures (Signed)
 PERFORMED ON - 07/17/2010 14:00:19;   DONE BY - RUELAS, JESSICA;  PROCEDURE - OXYGEN-LOW FLOW;   PROTOCOL DRIVEN: YES-MD INITIATED;   OXYGEN CHECK: DONE;   ADVERSE REACTIONS: NONE;   ELECTRONIC SIGNATURE DERIVED FROM A SINGLE CONTROLLED ACCESS PASSWORD:   Franne Grip ; 07/17/2010 15:24:19

## 2010-07-17 NOTE — Interdisciplinary (Signed)
Patient foley d/c intact and urinal kept at bedside.  Educated patient about using the urinal.  Monitor remains NSR and remains NPO.  Has sat up in chair several times for 30 minutes each and ambulated around the nurses station x2  Does IS at 1000 and has o2 currently at 2l.

## 2010-07-17 NOTE — Progress Notes (Addendum)
Surgery Progress Note    Patient Name: Shawn Mejia MRN: [id number] Room#: 2206/2206    Date: 07/16/2010  Hospital Day:   3 days - Admitted on: 07/14/2010  Post Operative Day(s): 3    SUBJECTIVE: No delirium overnight.  Pain control adequate.  Improved pulmonary status.    OBJECTIVE:     Vitals signs:  Latest entry:   Temperature: 98.6 F (37 C)  Heart Rate: 60   Respirations: 21   Blood pressure (BP): 131/87 mmHg  SpO2: 97 %  Weight - scale: 95.7 kg (210 lb 15.7 oz)  Percentage Weight Change (%): 1.59 %    RANGE(last 24 hrs):  BP  Min: 112/83  Max: 124/75  Temp  Avg: 98.9 F (37.2 C)  Min: 98.4 F (36.9 C)  Max: 99.9 F (37.7 C)  Pulse  Avg: 94.2   Min: 87   Max: 105   Resp  Avg: 14.8   Min: 12   Max: 18   SpO2  Avg: 96.4 %  Min: 91 %  Max: 100 %      Intake/Output    Date 07/16/10 0700 - 07/17/10 0659 07/17/10 0700 - 07/18/10 0659    Time 0865-7846 1500-2259 2300-0659 Total 0700-1459 1500-2259 2300-0659 Total   INTAKE    I.V.   690.5   100   250 1040.5                 Total Intake 690.5 [id number]       OUTPUT    Urine   250   350   225 825                 Total Output 250 350 225 825       NET I/O     440.5 -250 25 215.5                          Diet  NPO      Labs:  CBC: 18.1 this AM, HCT 36.2, platelets 225  Recent Labs   Basename 07/16/10 0145 07/15/10 0430   . WBC 27.6* 25.9*   . HGB 13.3* 14.3   . HCT 39.3* 42.0   . PLT 220 252   . BAND -- 5   . SEG 84* 82*   . LYMPHS 8* 10*   . MONOS 7 3*        Chemistry: 9/25 AM chem panel reviewed, WNL save phosphorus (2.0) which will be repleted  Recent Labs   Basename 07/16/10 0145 07/15/10 0430   . NA 138 139   . K 3.9 3.9   . CL 103 103   . BICARB 28 27   . BUN 12 10   . CREAT 0.77 0.77   . GLU 118* 126*   . [place] 7.9* 8.1*   . MG 1.8 --   . PHOS 1.3* --   . IONCA -- --       Coags  Recent Labs   Basename 07/16/10 0145 07/15/10 0430   . PT 16.7* 12.3   . PTT -- --   . INR 1.6 1.2       Physical Exam:  General Appearance: a&ox3, NAD, cooperative  Heart:  RRR, no  m/g/r  Lungs: ctab, diminished at bases, nonlabored, on 5L NC  Abdomen: soft, appropriately ttp around incision, mild distension; incision c/d/i  Extremities:  Wwp, 2+ periph pulses    Medications:  Current facility-administered medications   Medication   . furosemide (LASIX) injection 20 mg   . potassium PHOSphate 10 MEQ/100ML IVPB 10 mEq   . haloperidol lactate (HALDOL) injection 2.5 mg   . morphine injection 1 mg   . potassium PHOSphate 10 MEQ/100ML IVPB 10 mEq   . ipratropium-albuterol (DUO-NEB) 0.5-2.5 MG/3ML nebulizer solution 3 mL   . dextrose-sodium chloride 5%-0.45% infusion    . albuterol (PROVENTIL HFA) 108 (90 BASE) MCG/ACT inhaler 2-20 puff   . ketorolac (TORADOL) injection 30 mg   . naloxone (NARCAN) injection 0.1 mg   . glucose chewable tablet 16 g   . glucose 40% oral gel 1 Tube   . dextrose 50 % solution 12.5 g   . glucagon (GLUCAGON) injection 1 mg   . enoxaparin (LOVENOX) injection 40 mg   . famotidine (PEPCID) IVPB 20 mg   . famotidine (PEPCID) tablet 20 mg   . metoclopramide (REGLAN) injection 10 mg   . insulin regular (HUMULIN,NOVOLIN) injection 1-10 Units   . metoprolol (LOPRESSOR) injection 5 mg         ASSESSMENT / PLAN:   59 year old male with s/p whipple. POD3. Pulmonary status improving.  Pain control adequate with minimal narcotics.    - okay for small doses of prn morphine IV, cont toradol  - MIVF @70   - Lasix 20 mg IV x 1 now  - wean O2 per protocol, SaO2>92%, pulmonary toilet, OOBTC/ambulate  - replete lytes  - D/C foley    Maxwell Marion, MD  PGY-7    ATTENDING PROGRESS NOTE ATTESTATION    Subjective    I have reviewed the history.     Objective    I have examined the patient and concur with the resident/fellow exam.    Assessment and Plan    I agree with the resident/fellow care plan.  See the resident / fellow note for further details.      Erline Hau, MD  07/17/2010  10:39 AM

## 2010-07-17 NOTE — Procedures (Signed)
 PERFORMED ON - 07/17/2010 15:24:28;   DONE BY - RUELAS, JESSICA;  PROCEDURE - PDP RE-EVALUATION;   MD REQUEST #1: OXYGEN;   RCP RECOMMENDATION: #1: O2 PROTOCOL;   INDICATION: #1: OUTCOMES ACHIEVED;   EVALUATION OUTCOME: #1: AS RCP RECOMMENDED;   ADVERSE REACTIONS: NONE;   ELECTRONIC SIGNATURE DERIVED FROM A SINGLE CONTROLLED ACCESS PASSWORD:   Franne Grip ; 07/17/2010 15:24:28

## 2010-07-18 MED ORDER — POTASSIUM CHLORIDE 10 MEQ/100ML IV SOLN
10.0000 meq | INTRAVENOUS | Status: DC
Start: 2010-07-18 — End: 2010-07-18

## 2010-07-18 MED ORDER — MAGNESIUM SULFATE 40 MG/ML IJ SOLN
2.00 g | Freq: Once | INTRAMUSCULAR | Status: AC
Start: 2010-07-18 — End: 2010-07-18
  Administered 2010-07-18: 2 g via INTRAVENOUS
  Filled 2010-07-18: qty 50

## 2010-07-18 MED ORDER — FUROSEMIDE 10 MG/ML IJ SOLN
20.00 mg | Freq: Two times a day (BID) | INTRAMUSCULAR | Status: AC
Start: 2010-07-18 — End: 2010-07-18
  Administered 2010-07-18 (×2): 20 mg via INTRAVENOUS
  Filled 2010-07-18 (×2): qty 2

## 2010-07-18 MED ORDER — SODIUM CHLORIDE 0.9 % IV SOLN
10.00 meq | INTRAVENOUS | Status: AC
Start: 2010-07-18 — End: 2010-07-18
  Administered 2010-07-18 (×2): 10 meq via INTRAVENOUS
  Filled 2010-07-18 (×6): qty 5

## 2010-07-18 NOTE — Interdisciplinary (Signed)
Report off to [employer]. No acute changes at this time. Patient stable. Patient denies any further needs.

## 2010-07-18 NOTE — Progress Notes (Signed)
Surg Onc Daily Progress Note:  07/18/2010     Current Hospital Stay:   4 days - Admitted on: 07/14/2010  POD # 4 s/p Whipple    Subjective:  Doing much better.  Feeling well, only mild nausea but hungry, passing some flatus.  No fevers/chills, breathing well. Pain controlled with toradol at this point.    Objective:  No acute events, afebrile    Vital Signs:  Temperature:  [98.4 F (36.9 C)-99.3 F (37.4 C)] 98.6 F (37 C) (09/26 0730)  Blood pressure (BP): (112-133)/(68-84) 132/78 mmHg (09/26 0730)  Heart Rate:  [54-77] 54  (09/26 0730)  Respirations:  [15-19] 15  (09/26 0730)  Pain Score:  [-] 2 (09/26 0730)  O2 Device:  [-] Oximizer (NC) (09/26 0730)  O2 Flow Rate (L/min):  [1 l/min-2.5 l/min] 1 l/min (09/26 0730)  SpO2:  [95 %-99 %] 95 % (09/26 0730)    Wt Readings from Last 1 Encounters:   07/16/10 95.7 kg (210 lb 15.7 oz)         Intake/Output (Current Shift):  I/O:  1970/2375  maint @ 70  UOP: 2375    Physical Exam:  General Appearance: healthy, alert, no distress, pleasant affect, cooperative.  Heart:  normal rate and regular rhythm, no murmurs, clicks, or gallops.  Lungs: clear to auscultation and percussion, no chest deformities noted.  Abdomen: soft, appropriately midly tender, nondistended, incision clean, dry, intact.    Laboratory data:   Lab Results   Component Value Date    NA 144 07/18/2010    K 3.3* 07/18/2010    CL 107 07/18/2010    BICARB 27 07/18/2010    BUN 18 07/18/2010    CREAT 0.83 07/18/2010    GLU 128* 07/18/2010    [place] 8.1* 07/18/2010       Lab Results   Component Value Date    WBC 14.8* 07/18/2010    HGB 12.2* 07/18/2010    HCT 36.4* 07/18/2010    PLT 263 07/18/2010         Assessment and Plan:  59 year old man POD # 4 s/p Whipple for pancreatic head mass  1.  Pulmonary status - difficultly with oxygenation postop, now much improved on 2 liters oxygen.  Continue aggressive RT and pulmonary toilet.  Will give lasix twice again today (20 mg) to continue diuresis  2.  OOB and ambulate  3.  Toradol  for pain control, trying to avoid narcotics  4.  Start ice chips, await until POD # 5 to start liquids    ATTENDING PROGRESS NOTE ATTESTATION    Subjective    I have reviewed the history.     Objective    I have examined the patient and concur with the resident/fellow exam.    Assessment and Plan    I agree with the resident/fellow care plan.  See the resident / fellow note for further details.      Erline Hau, MD  07/18/2010  9:10 AM

## 2010-07-18 NOTE — Plan of Care (Signed)
Problem: Pain - Acute  Goal: Communication of presence of pain  Outcome: Met  Rate his pain at 2/10. Getting toradol  Every 6 hours.

## 2010-07-18 NOTE — Interdisciplinary (Signed)
Report received from [facility] and care assumed.  Patient resting comfortably with wife at bedside.  Monitor NSR without ectopic and VS taken.  IVF at 70 cc/hr and taking some ice chips.  Abd softer than yesterday and is passing gas this am.  Voids qs amount.

## 2010-07-18 NOTE — Interdisciplinary (Signed)
 Patient re-assessed. Vital signs are stable. Patient assessment unchanged. Patient stable. Patient denies pain at this time. Patient has comfort measures in place and is refusing any additional interventions at this time. Patient has call light and all other requested personal belongings within reach. Will continue to monitor patient for any changes in condition. Will continue plan of care.

## 2010-07-18 NOTE — Procedures (Signed)
 PERFORMED ON - 07/18/2010 13:00:27;   DONE BY - LONE ELK, JESSICA;  PROCEDURE - PDP RE-EVALUATION;   NEURO STATUS: AWAKE;   HEMOGLOBIN: 12.2;   PRE-TITR FIO2 OR L/M: 1L;   PRE-TITR SAO2: 96;   TITRATED FIO2 OR L/M: RA;   POST TITRATION SAO2: 91;   MD REQUEST #1: OXYGEN;   RCP RECOMMENDATION: #1: O2 PROTOCOL;   INDICATION: #1: OUTCOMES ACHIEVED;   EVALUATION OUTCOME: #1: AS RCP RECOMMENDED;   ADVERSE REACTIONS: NONE;   ELECTRONIC SIGNATURE DERIVED FROM A SINGLE CONTROLLED ACCESS PASSWORD: Brien Few, JESSICA ; 07/18/2010 16:00:27

## 2010-07-18 NOTE — Interdisciplinary (Signed)
Mid-abd incision dressing changed. Incision well approximated and staples intact. Serous drainage quarter size to the lower dressing. ABD pad applied and secured with tape.

## 2010-07-18 NOTE — Procedures (Signed)
 PERFORMED ON - 07/18/2010 10:45:14;   DONE BY - LONE ELK, JESSICA;  PROCEDURE - OXYGEN-LOW FLOW;   PROTOCOL DRIVEN: YES-MD INITIATED;   OXYGEN CHECK: DONE;   ADVERSE REACTIONS: NONE;   ELECTRONIC SIGNATURE DERIVED FROM A SINGLE CONTROLLED ACCESS PASSWORD: Brien Few, JESSICA ; 07/18/2010 16:00:14

## 2010-07-19 MED ORDER — NORTRIPTYLINE HCL 10 MG OR CAPS
10.0000 mg | ORAL_CAPSULE | Freq: Every evening | ORAL | Status: DC
Start: 2010-07-19 — End: 2010-07-22
  Administered 2010-07-19 – 2010-07-21 (×3): 10 mg via ORAL
  Filled 2010-07-19 (×3): qty 1

## 2010-07-19 MED ORDER — LIDOCAINE HCL 1 % IJ SOLN
10.00 meq | INTRAMUSCULAR | Status: AC
Start: 2010-07-19 — End: 2010-07-19
  Administered 2010-07-19 (×5): 10 meq via INTRAVENOUS
  Filled 2010-07-19 (×6): qty 5

## 2010-07-19 MED ORDER — PANCRELIPASE (LIP-PROT-AMYL) 24000-76000 UNITS PO CPEP
2.0000 | ORAL_CAPSULE | Freq: Three times a day (TID) | ORAL | Status: DC
Start: 2010-07-19 — End: 2010-07-22
  Administered 2010-07-19 – 2010-07-22 (×9): 2 via ORAL
  Filled 2010-07-19: qty 100

## 2010-07-19 MED ORDER — GABAPENTIN 600 MG OR TABS
300.0000 mg | ORAL_TABLET | Freq: Every evening | ORAL | Status: DC
Start: 2010-07-19 — End: 2010-07-22
  Administered 2010-07-19 – 2010-07-21 (×3): 300 mg via ORAL
  Filled 2010-07-19 (×3): qty 1

## 2010-07-19 MED ORDER — POTASSIUM CHLORIDE 10 MEQ/100ML IV SOLN
10.0000 meq | INTRAVENOUS | Status: DC
Start: 2010-07-19 — End: 2010-07-19

## 2010-07-19 NOTE — Interdisciplinary (Signed)
 Assessment as per data base. V/s stable. Pt under no acute distress. Pt currently has no c/o of pain at this time. Pt was instructed to call RN should pain delvelop. Pt verbalized understanding. Bed is in lowest position. Side rails up times 2. Call light is within reach. Pt instructed to call for assist

## 2010-07-19 NOTE — Plan of Care (Signed)
 Problem: Breathing Pattern - Ineffective  Goal: Respiratory rate, rhythm and depth return to baseline  Outcome: Met  Patient resp values are within expected ranges. Patient is clear to diminished breath sounds. Patient rythm and rate are within normal ranges. Patient depth is WDL. Patient denies cough, shortness of breath or dyspnea. No apparent respiratory distress at this time. Will continue to monitor patient respiratory values. Will continue plan of care.

## 2010-07-19 NOTE — Procedures (Signed)
 PERFORMED ON - 07/19/2010 14:30:46;   DONE BY - BIANCHI, ALLISON M;  PROCEDURE - PDP RE-EVALUATION;   NEURO STATUS: SLEEPING;   HEMOGLOBIN: 12.0;   PRE-TITR FIO2 OR L/M: 1;   PRE-TITR SAO2: 96;   MD REQUEST #1: OXYGEN;   RCP RECOMMENDATION: #1: O2 PROTOCOL;   INDICATION: #1: O: TO KEEP SA02 >92;   EVALUATION OUTCOME: #1: AS RCP RECOMMENDED;   MD REQUEST #2: MDI PRN;   RCP RECOMMENDATION: #2: MDI PRN;   INDICATION: #2: B: PRN FOR SOB;   EVALUATION OUTCOME: #2: AS RCP RECOMMENDED;   MD REQUEST #3: SVN PRN;   RCP RECOMMENDATION: #3: SVN PROTOCOL;   INDICATION: #3: B: PRN FOR SOB;   EVALUATION OUTCOME: #3: AS RCP RECOMMENDED;   ADVERSE REACTIONS: NONE;   ELECTRONIC SIGNATURE DERIVED FROM A SINGLE CONTROLLED ACCESS PASSWORD:   Sibyl Parr ; 07/19/2010 14:58:46

## 2010-07-19 NOTE — Interdisciplinary (Signed)
Checked oxygen saturation after removing NC for 20 minutes. Oxygen level at 86%. However, denied any SOB. Placed back supplemental oxygen at 1L NC.

## 2010-07-19 NOTE — Interdisciplinary (Signed)
Patient lives with wife , son and daughter. Does not have FWW. Has not had HHN. Do not anticipate any d/c needs.

## 2010-07-19 NOTE — Progress Notes (Signed)
Surgery Progress Note    Patient Name: Shawn Mejia MRN: [id number] Room#: 2206/2206    Date: 07/19/2010  Hospital Day:   5 days - Admitted on: 07/14/2010  Post Operative Day(s): 5 s/p Whipple    SUBJECTIVE: pain well controlled, continues to feel better, decreased O2 requirement, passing flatus, hungry.    OBJECTIVE:     Vitals signs:  Latest entry:   Temperature: 98.1 F (36.7 C)  Heart Rate: 58   Respirations: 18   Blood pressure (BP): 133/82 mmHg  SpO2: 96 %  Weight - scale: 95.7 kg (210 lb 15.7 oz)  Percentage Weight Change (%): 1.59 %    RANGE(last 24 hrs):  BP  Min: 112/71  Max: 133/82  Temp  Avg: 98.4 F (36.9 C)  Min: 98.1 F (36.7 C)  Max: 98.6 F (37 C)  Pulse  Avg: 62.6   Min: 54   Max: 75   Resp  Avg: 18.2   Min: 15   Max: 24   SpO2  Avg: 95.8 %  Min: 94 %  Max: 97 %      Intake/Output    Date 07/18/10 0700 - 07/19/10 0659 07/19/10 0700 - 07/20/10 0659    Time 6213-0865 1500-2259 2300-0659 Total 0700-1459 1500-2259 2300-0659 Total   INTAKE    P.O.           240 240                 I.V.   1400   280   621.7 2301.7                 Total Intake 1400 280 861.7 2541.7       OUTPUT    Urine   800   750   300 1850                 Total Output 800 [phone]       NET I/O     600 -470 561.7 691.7                          Diet  Sips      Labs:  CBC  Recent Labs   Basename 07/19/10 0650 07/18/10 0330   . WBC 11.4* 14.8*   . HGB 12.0* 12.2*   . HCT 36.0* 36.4*   . PLT 250 263   . BAND -- --   . SEG 58 66   . LYMPHS 27 21   . MONOS 12 11        Chemistry  Recent Labs   Basename 07/18/10 0330 07/17/10 1900 07/17/10 0440   . NA 144 -- 141   . K 3.3* -- 3.7   . CL 107 -- 105   . BICARB 27 -- 25   . BUN 18 -- 15   . CREAT 0.83 -- 0.69   . GLU 128* -- 141*   . [place] 8.1* -- 8.2*   . MG 1.9 -- 1.9   . PHOS 2.7 2.3* --   . IONCA -- -- --       Coags  Recent Labs   Basename 07/18/10 0330 07/17/10 0440   . PT 11.8 12.9*   . PTT -- --   . INR 1.1 1.2     Physical Exam:  General Appearance: sitting comfortably, NAD  Heart:  normal  rate and regular rhythm, no murmurs, clicks, or gallops.  Lungs:  ctab, continued improvement; saturating well on 1L NC  Abdomen: soft, mild ttp, nondistended; incision c/d/i    Medications:  Current facility-administered medications   Medication   . magnesium sulfate 2G/50ML IVPB 2 g   . furosemide (LASIX) injection 20 mg   . potassium chloride 10 mEq/100 mL NS with lidocaine IVPB    . DISCONTD: potassium chloride 10 MEQ/100ML IVPB 10 mEq   . haloperidol lactate (HALDOL) injection 2.5 mg   . morphine injection 1 mg   . ipratropium-albuterol (DUO-NEB) 0.5-2.5 MG/3ML nebulizer solution 3 mL   . dextrose-sodium chloride 5%-0.45% infusion    . albuterol (PROVENTIL HFA) 108 (90 BASE) MCG/ACT inhaler 2-20 puff   . ketorolac (TORADOL) injection 30 mg   . naloxone (NARCAN) injection 0.1 mg   . glucose chewable tablet 16 g   . glucose 40% oral gel 1 Tube   . dextrose 50 % solution 12.5 g   . glucagon (GLUCAGON) injection 1 mg   . enoxaparin (LOVENOX) injection 40 mg   . famotidine (PEPCID) IVPB 20 mg   . famotidine (PEPCID) tablet 20 mg   . metoclopramide (REGLAN) injection 10 mg   . insulin regular (HUMULIN,NOVOLIN) injection 1-10 Units   . metoprolol (LOPRESSOR) injection 5 mg         ASSESSMENT / PLAN:   59 year old male with POD5 s/p whipple for pancreatic head mass    - continue current pain control  - start clears this AM; may begin transition to PO pain meds later today if tolerating clears  - d/c MIVF later if tolerating clears  - pulm toilet, OOB, IS  - will hold off on lasix today; acute pulmonary edema post-operatively now resolved; wean O2 as tolerated    D/w Dr. Arrie Eastern, PGY1

## 2010-07-19 NOTE — Plan of Care (Signed)
Problem: Pain - Acute  Goal: Communication of presence of pain  Pt stated her pain has been well controlled today. Pt stated the toradol is working very well

## 2010-07-19 NOTE — Interdisciplinary (Signed)
07/19/10 1132   Referral Data   Referral Source Family  (request help with financial POA)   Referral Name Patient and wife Synetta Fail (cell)  [phone]. lve in [place], wife does not drive. Recieving SDI, and need POA to deposit SDI payment   Referral Reason Counseling/Support   [facility] Pt. family anticipating approx 2 week inpatient hospitalization. Will return home at d/c with no anticipated needs.     Patient Information   Primary Caregiver Spouse   Support System Immediate Family;Friends   Referral To   [facility] (notary and resources for [employer] provided.)      No further needs identified at this time. Social work will continue to remain available as needed.    Wilhemina Cash LCSW pager [phone]

## 2010-07-19 NOTE — Interdisciplinary (Signed)
Paged MD. Potassium 3.3

## 2010-07-19 NOTE — Interdisciplinary (Signed)
 No changes in Pt status. V/s stable. Pt under no acute distress. No changes from initial assessment. Will cont to monitor. Pt instructed to call for assist

## 2010-07-19 NOTE — Interdisciplinary (Signed)
-  pt s/p Whipple on 9/22, POD#5, just advanced to CL, +BS, +flatus noted, will monitor further diet advancement and PO tolerance  C. Rayetta Pigg RD, CNSC, pager [phone]

## 2010-07-19 NOTE — Plan of Care (Signed)
Problem: Falls - Risk of  Goal: Absence of falls  Intervention: Nurses will use the fall risk assessment tool to stratify their patient's level of fall risk  Patient verbalizes understanding of moderate fall risk and calls for assistance oob. Patient has been oriented to room. Fall precautions implemented bed is in low/locked position, call light & tray table within reach, side rails up x2. Wearing nonskid footwear and Emm[place] Garraway board in use. Patient prefers room door to remain open. Hourly and prn rounds for patient needs/safety assessed. Patient room kept free and clear of clutter. No falls this shift.

## 2010-07-19 NOTE — Interdisciplinary (Signed)
Pt ambulating in hall with wife. Pt gait is steady.

## 2010-07-19 NOTE — Plan of Care (Signed)
Problem: Infection  Goal: Absence of infection signs and symptoms  Outcome: Met  Pt vital signs have remained within defined limits. Pt has remained afebrile, no signs of tachycardia or diaphoresis noted. There is no s/s of infection noted

## 2010-07-19 NOTE — Procedures (Signed)
 PERFORMED ON - 07/19/2010 09:05:37;   DONE BY - BIANCHI, ALLISON M;  PROCEDURE - OXYGEN-LOW FLOW;   PROTOCOL DRIVEN: YES-MD INITIATED;   OXYGEN CHECK: DONE;   ADVERSE REACTIONS: NONE;   ELECTRONIC SIGNATURE DERIVED FROM A SINGLE CONTROLLED ACCESS PASSWORD:   Sibyl Parr ; 07/19/2010 14:42:37

## 2010-07-19 NOTE — Interdisciplinary (Signed)
Pt ambulating in hall Gait steady. Wife at side

## 2010-07-19 NOTE — Progress Notes (Signed)
Passing flatus. Plan start clears, advance as tolerated.

## 2010-07-19 NOTE — Interdisciplinary (Signed)
 Patient re-assessed. Patient assessment unchanged. Patient stable. Patient denies pain at this time. Patient has comfort measures in place and is refusing any additional interventions at this time. Patient has call light and all other requested personal belongings within reach. Will continue to monitor patient for any changes in condition. Will continue plan of care.

## 2010-07-19 NOTE — Brief Op Note (Signed)
Removed the supplemental oxygen 1L NC. Oxygen saturation at 96%. Will recheck in 15 minutes to see if he maintains it or desart.

## 2010-07-20 ENCOUNTER — Other Ambulatory Visit (HOSPITAL_BASED_OUTPATIENT_CLINIC_OR_DEPARTMENT_OTHER): Payer: Self-pay | Admitting: Surgical Oncology

## 2010-07-20 MED ORDER — OXYCODONE-ACETAMINOPHEN 5-325 MG OR TABS
1.0000 | ORAL_TABLET | ORAL | Status: DC | PRN
Start: 2010-07-20 — End: 2010-07-20

## 2010-07-20 MED ORDER — METOPROLOL SUCCINATE 25 MG OR TB24
25.0000 mg | ORAL_TABLET | Freq: Every day | ORAL | Status: DC
Start: 2010-07-21 — End: 2010-07-22
  Administered 2010-07-21 – 2010-07-22 (×2): 25 mg via ORAL
  Filled 2010-07-20 (×2): qty 1

## 2010-07-20 MED ORDER — IBUPROFEN 400 MG OR TABS
400.0000 mg | ORAL_TABLET | Freq: Four times a day (QID) | ORAL | Status: DC | PRN
Start: 2010-07-20 — End: 2010-07-22
  Administered 2010-07-20 (×2): 400 mg via ORAL
  Filled 2010-07-20 (×2): qty 1

## 2010-07-20 MED ORDER — HYDROMORPHONE HCL 4 MG OR TABS
4.0000 mg | ORAL_TABLET | ORAL | Status: DC | PRN
Start: 2010-07-20 — End: 2010-07-21
  Administered 2010-07-20 – 2010-07-21 (×6): 4 mg via ORAL
  Filled 2010-07-20 (×6): qty 1

## 2010-07-20 MED ORDER — HELP
Freq: Two times a day (BID) | Status: DC
Start: 2010-07-21 — End: 2010-07-22
  Filled 2010-07-20 (×7): qty 1

## 2010-07-20 MED ORDER — HYDROMORPHONE HCL 2 MG OR TABS
2.0000 mg | ORAL_TABLET | ORAL | Status: DC | PRN
Start: 2010-07-20 — End: 2010-07-21
  Administered 2010-07-20 (×2): 2 mg via ORAL
  Filled 2010-07-20 (×2): qty 1

## 2010-07-20 MED ORDER — MAGNESIUM HYDROXIDE 2400 MG/10ML PO SUSP
10.00 mL | Freq: Once | ORAL | Status: AC
Start: 2010-07-20 — End: 2010-07-20
  Administered 2010-07-20: 10 mL via ORAL
  Filled 2010-07-20: qty 10

## 2010-07-20 MED ORDER — MORPHINE SULFATE 2 MG/ML IJ SOLN
1.0000 mg | INTRAMUSCULAR | Status: DC | PRN
Start: 2010-07-20 — End: 2010-07-21
  Administered 2010-07-20: 1 mg via INTRAVENOUS
  Filled 2010-07-20: qty 1

## 2010-07-20 NOTE — Progress Notes (Signed)
Surgery Progress Note    Patient Name: Shawn Mejia MRN: [id number] Room#: 2206/2206    Date: 07/20/2010  Hospital Day:   6 days - Admitted on: 07/14/2010  Post Operative Day(s): 6 s/p Whipple    SUBJECTIVE: advanced to soft mechanical diet yesterday, tolerating diet. Pain controlled. Passing flatus, one small BM. Off all IV fluids. Saturating well on room air    OBJECTIVE:     Vitals signs:  Latest entry:   Temperature: 99 F (37.2 C)  Heart Rate: 72   Respirations: 20   Blood pressure (BP): 126/69 mmHg  SpO2: 91 %  Weight - scale: 95.7 kg (210 lb 15.7 oz)  Percentage Weight Change (%): 1.59 %    RANGE(last 24 hrs):  BP  Min: 112/67  Max: 134/78  Temp  Avg: 98.4 F (36.9 C)  Min: 97.5 F (36.4 C)  Max: 99 F (37.2 C)  Pulse  Avg: 66.8   Min: 58   Max: 78   Resp  Avg: 18.5   Min: 18   Max: 20   SpO2  Avg: 94.6 %  Min: 91 %  Max: 96 %      Intake/Output    Date 07/19/10 0700 - 07/20/10 0659 07/20/10 0700 - 07/21/10 0659    Time 0700-1459 1500-2259 2300-0659 Total 0700-1459 1500-2259 2300-0659 Total   INTAKE    P.O.   400   100     500                 I.V.   100   1647.2     1747.2                 Total Intake 500 1747.2  2247.2       OUTPUT    Urine   300   450   200 950                 Total Output 300 450 200 950       NET I/O     200 1297.2 -200 1297.2                          Diet  Soft mechanical      Labs:  CBC  Recent Labs   Basename 07/19/10 0650 07/18/10 0330   . WBC 11.4* 14.8*   . HGB 12.0* 12.2*   . HCT 36.0* 36.4*   . PLT 250 263   . BAND -- --   . SEG 58 66   . LYMPHS 27 21   . MONOS 12 11        Chemistry  Recent Labs   Basename 07/19/10 0650 07/18/10 0330   . NA 144 144   . K 3.3* 3.3*   . CL 108* 107   . BICARB 27 27   . BUN 25* 18   . CREAT 0.82 0.83   . GLU 129* 128*   . [place] 8.2* 8.1*   . MG 2.0 1.9   . PHOS 3.5 2.7   . IONCA -- --       Coags  Recent [employer] 07/19/10 0650 07/18/10 0330   . PT 12.5 11.8   . PTT -- --   . INR 1.2 1.1       Physical Exam:  General Appearance: sitting comfortably,  NAD   Heart: normal rate and regular rhythm, no murmurs, clicks, or gallops.  Lungs: ctab, no w/c/r, saturating well on RA  Abdomen: soft, mild ttp, nondistended; incision c/d/i    Medications:  Current facility-administered medications   Medication   . oxyCODONE-acetaminophen (PERCOCET) 5-325 MG tablet 1 tablet   . morphine injection 1 mg   . ibuprofen (MOTRIN) tablet 400 mg   . potassium chloride 10 mEq/100 mL NS with lidocaine IVPB    . pancrealipase (CREON 24000) DR capsule 2 capsule   . nortriptyline (PAMELOR) capsule 10 mg   . gabapentin (NEURONTIN) tablet 300 mg   . DISCONTD: potassium chloride 10 MEQ/100ML IVPB 10 mEq   . haloperidol lactate (HALDOL) injection 2.5 mg   . DISCONTD: morphine injection 1 mg   . ipratropium-albuterol (DUO-NEB) 0.5-2.5 MG/3ML nebulizer solution 3 mL   . albuterol (PROVENTIL HFA) 108 (90 BASE) MCG/ACT inhaler 2-20 puff   . ketorolac (TORADOL) injection 30 mg   . DISCONTD: dextrose-sodium chloride 5%-0.45% infusion    . naloxone [facility]) injection 0.1 mg   . glucose chewable tablet 16 g   . glucose 40% oral gel 1 Tube   . dextrose 50 % solution 12.5 g   . glucagon (GLUCAGON) injection 1 mg   . enoxaparin (LOVENOX) injection 40 mg   . famotidine (PEPCID) IVPB 20 mg   . famotidine (PEPCID) tablet 20 mg   . metoclopramide (REGLAN) injection 10 mg   . insulin regular (HUMULIN,NOVOLIN) injection 1-10 Units   . metoprolol (LOPRESSOR) injection 5 mg         ASSESSMENT / PLAN:   59 year old male POD6 s/p whipple for pancreatic head mass    - add on percocet and motrin for pain control; toradol stopped 2/2 use for 5 days  - cont soft mechanical diet, has early satiety; awaiting full return of bowel function  - pulm toilet, OOB, IS  - dispo: likely d/c home in next 1-2 days with continued improvement    D/w Dr. Arrie Eastern, PGY1

## 2010-07-20 NOTE — Interdisciplinary (Signed)
Ambulating the hallways with wife

## 2010-07-20 NOTE — Interdisciplinary (Signed)
Patient c/o pain in abdomen and rated as 7/10. Administered morphine 1mg  IV as ordered.

## 2010-07-20 NOTE — Plan of Care (Signed)
Problem: Pain - Acute  Goal: Control of acute pain  Outcome: Not Met  Pt is currently sleeping. Pt is AOX3, verbalizes understanding, pt calls for pain medications. However pt of moderate and sever ab pain this morning and this afternoon for which dilaudid PO motrin PO was given as ordered. I will continue to monitor and reassess.     Problem: Falls - Risk of  Goal: Absence of falls  Outcome: Met  No incident of falls or injury on this shift. I instructed the pt to use his call light to ask for assistance when needing to ambulate. Call light is within reach. The pt ambulates without assist and has a steady gait. His bed is at the lowest level with side rail raised X2.  The pt is AOX3. Wife at bedside.    Problem: Infection  Goal: Absence of infection signs and symptoms  Outcome: Not Met  No signs or symptoms of infection observed.Ab incision is dressed with dry guaze with no observable drainage. Pt is afebrile at 98.6. WBC: 19.9. Heart rate: 80. Respirations: 18.     Problem: Breathing Pattern - Ineffective  Goal: Respiratory rate, rhythm and depth return to baseline  Outcome: Not Met  Lung sounds are diminished throughout. Breathing is unlabored at rest. O2 is 92% on 5L NC. Pt denies breathing difficulty at this time. He reports cough with small amounts of clear, thick, sputum. No acute respiratory distress.

## 2010-07-20 NOTE — Interdisciplinary (Signed)
I paged [facility] to report the following  The pt would like to speak to you about pain management.  Awaiting new orders/return call.

## 2010-07-20 NOTE — Plan of Care (Signed)
Problem: Pain - Acute  Goal: Communication of presence of pain  Outcome: Met  Rated pain as 2/10 for most part.  Goal: Response to pain management methods  Outcome: Met  Morphine 1mg  IV administered x1 and patient verbalized relief of pain.    Problem: Discharge Planning  Goal: Able to perform ADL- independently or with minimal assist  Outcome: Met  Ambulate in the hallways and to the bathroom with minimal assist    Problem: Breathing Pattern - Ineffective  Goal: Respiratory rate, rhythm and depth return to baseline  Outcome: Met  On room air oxygen level at 96%

## 2010-07-20 NOTE — Interdisciplinary (Signed)
Paged MD at 1943, "Pt  Shawn Mejia, Mc room 263 on IV Metoprolol scheduled q 6hrs. Pt not on Tele. Pls call. "

## 2010-07-20 NOTE — Interdisciplinary (Signed)
Nutrition Education  Pt reported eating ok, small portion, no GI discomfort.  Provided verbal and written education on diet following Whipples procedure. Pt stated he usually does not eat sweets or fatty foods anyway. Encouraged small frequent meals to ensure adequate nutrient intake.  Pt demonstrated understanding and willingness to follow the diet given.  Will continue to monitor per policy.  [name], MS, RD, CNSC. Pager [phone]

## 2010-07-20 NOTE — Progress Notes (Signed)
Feels well, not much appetite. WBC elevated to 19K- asymptomatic- observation for now.

## 2010-07-20 NOTE — Interdisciplinary (Signed)
HR 61, therefore, lopressor not given.

## 2010-07-21 MED ORDER — LANSOPRAZOLE 30 MG OR CPDR
30.0000 mg | DELAYED_RELEASE_CAPSULE | Freq: Two times a day (BID) | ORAL | Status: DC
Start: 2010-07-21 — End: 2010-07-22
  Administered 2010-07-21 – 2010-07-22 (×2): 30 mg via ORAL
  Filled 2010-07-21 (×2): qty 1

## 2010-07-21 MED ORDER — OXYCODONE HCL 5 MG OR TABS
10.0000 mg | ORAL_TABLET | ORAL | Status: DC | PRN
Start: 2010-07-21 — End: 2010-07-21
  Administered 2010-07-21: 10 mg via ORAL
  Filled 2010-07-21: qty 2

## 2010-07-21 MED ORDER — CALCIUM CARBONATE ANTACID 500 MG OR CHEW
500.0000 mg | CHEWABLE_TABLET | Freq: Four times a day (QID) | ORAL | Status: DC | PRN
Start: 2010-07-21 — End: 2010-07-22
  Administered 2010-07-21: 500 mg via ORAL
  Filled 2010-07-21: qty 1

## 2010-07-21 MED ORDER — HYDROMORPHONE HCL 2 MG OR TABS
2.0000 mg | ORAL_TABLET | ORAL | Status: DC | PRN
Start: 2010-07-21 — End: 2010-07-22
  Administered 2010-07-21 – 2010-07-22 (×5): 2 mg via ORAL
  Filled 2010-07-21 (×5): qty 1

## 2010-07-21 MED ORDER — MAGNESIUM HYDROXIDE 2400 MG/10ML PO SUSP
10.0000 mL | Freq: Two times a day (BID) | ORAL | Status: DC | PRN
Start: 2010-07-21 — End: 2010-07-22
  Administered 2010-07-21: 10 mL via ORAL
  Filled 2010-07-21: qty 10

## 2010-07-21 MED ORDER — HYDROMORPHONE HCL 2 MG OR TABS
1.0000 mg | ORAL_TABLET | ORAL | Status: DC | PRN
Start: 2010-07-21 — End: 2010-07-22

## 2010-07-21 MED ORDER — OXYCODONE HCL 5 MG OR TABS
5.0000 mg | ORAL_TABLET | ORAL | Status: DC | PRN
Start: 2010-07-21 — End: 2010-07-21

## 2010-07-21 NOTE — Procedures (Signed)
FINAL PATHOLOGIC DIAGNOSIS:  A: Bile duct margin, excision   -Margin negative, see synoptic report.  B: Pancreatic margin, excision   -Margin negative, see synoptic report.  C: Gallbladder, cholecystectomy   -Benign gallbladder.  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   -Fifteen lymph nodes negative for malignancy (0/15).   -Final margins negative for malignancy, see comment.   -Duodenum with no significant histopathology.  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 Please note, however, that cautery  artifact in specimen B somewhat limits interpretation.  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  -AJCC/ UICC (pTNM) Stage: pTisN0  -Additional comments: Case discussed with Dr. Jetta Lout on 07/20/2010 (MP).     SPECIMEN(S) SUBMITTED:  A: bile duct margin  B: pancreatic margin  C: gallbladder  D: pancreaticoduodenectomy  CLINICAL HISTORY: Pancreatic mass.  INTRAOPERATIVE CONSULTATION (Frozen Section):  A: No evidence of malignancy. Diagnosis acknowledged by Dr. Jetta Lout. (MP)  B: No evidence of malignancy. Diagnosis acknowledged by Dr. Jetta Lout. (MP)     GROSS DESCRIPTION:  A: The specimen (received without fixative for frozen section analysis and  fixed overnight, labeled with the patient's name, medical record number and  "bile  duct margin FS") consists of a ring-shaped 0.9 x 0.5 x 0.3 cm  mucosally covered fragment of tissue entirely submitted for frozen section  analysis. The frozen section control is transferred to cassette A1.  B: The specimen (received without fixative for frozen section analysis and  fixed overnight, labeled with the patient's name, medical record number and  "pancreatic margin FS") consists of a flat roughly disc-shaped portion of  pancreas with duct measuring 1.8 x 1.5 x 0.3 cm. The cauterized surface is  inked blue and the un-cauterized surface is inked black. The specimen is  serially sectioned and entirely embedded on edge for frozen section  analysis. The frozen section control is transferred to cassette B1.  C: The specimen (received in formalin and fixed overnight, labeled with the  patient's name, medical record number and "gallbladder") consists of a  gallbladder measuring 7.3 x 2.7 x 1.6 cm. The serosal surface is tan-yellow  and smooth. The adventitial surface is tan-brown and coarse. Approximately  2.0 cc of green-brown viscous fluid is contained within the lumen. No  calculi are contained within the lumen. The mucosal surface is green-brown  and velvety. No masses are grossly identified. The wall ranges in thickness  from 0.1 to 0.5 cm. Representative sections and the entire shaved cystic  duct margin (inked black) are submitted in cassette C1.  D: The specimen (received without fixative and subsequently fixed in  formalin overnight, labeled with the patient's name, medical record number  and "pancreaticoduodenectomy") consists of a segment of duodenum measuring  20.4 cm in length x 6.0 cm in circumference with a possible small portion  of distal stomach and a segment of pancreas measuring 6.8 x 5.5 x 2.0 cm.  The retroperitoneal margin is inked blue and the pancreatic neck margin is  inked black. Sectioning reveals an area of dilated vessels with surrounding  hemorrhage in the uncinate measuring 1.5 x  1.4 x 1.2 cm. This area is 3.4  cm from the pancreatic neck margin and 0.5 cm from the retroperitoneal  margin. There is a firm, ill-defined, chalky, tan-yellow area adjacent to  the area of dilated vessels measuring 2.0 x 1.5 x 0.7 cm. This area is 1.4  cm from the pancreatic duct margin and 2.0 cm from the retroperitoneal  margin. The pancreatic duct and common bile duct are smooth-lined with no  masses grossly identified. The remainder of the pancreatic parenchyma is  tan-yellow and lobulated. The duodenal mucosa is dusky towards the distal  end, and otherwise grossly unremarkable. Three peripancreatic lymph node  candidates are identified ranging from 0.4 to 0.7 cm in greatest dimension  and 14 gastric potential lymph nodes are identified ranging from 0.3 to 0.8  cm in greatest dimension. The specimen is submitted per cassette summary.  Cassette Summary:  D1: proximal duodenal/stomach, shaved margin  D2: distal duodenal shaved margin  D3: common bile duct shaved margin  D4: ampulla  D5-6: pancreatic neck margin, shaved and serially sectioned  D7: retroperitoneal margin and dilated vessel area  D8-9: dilated vessel area  D10-12: firm, ill-defined area  D13: dusky and adjacent normal duodenal mucosa  D14: three peripancreatic potential lymph nodes  D15: five gastric potential lymph nodes  D16: five gastric potential lymph nodes  D17: four gastric potential lymph nodes  D18-20: remainder of dilated vessels with surrounding hemorrhagic area  MP/AP/jb  CONFIDENTIAL HEALTH INFORMATION: Health Care information is personal and  sensitive information. If it is being faxed to you it is done so under  appropriate authorization from the patient or under circumstances that do  not require patient authorization. You, the recipient, are obligated to  maintain it in a safe, secure and confidential manner. Re-disclosure  without additional patient consent or as permitted by law is prohibited.  Unauthorized re-disclosure or  failure to maintain confidentiality could  subject you to penalties described in federal and state law. If you have  received this report or facsimile in error, please notify the [place]  [facility] immediately and destroy the received document(s).   Material reviewed and Interpreted and  Report Electronically Signed by:  Sofie Rower M.D., PhD [phone])  Attending Surgical Pathologist  07/21/10 16:33  Electronic Signature derived from a single  controlled access password

## 2010-07-21 NOTE — Progress Notes (Signed)
Feels well- check CT given persistent white count

## 2010-07-21 NOTE — Procedures (Signed)
PERFORMED ON - 07/20/2010 20:00:26;   DONE BY - GUDMUNDSSON, SCARLETT;  PROCEDURE - AERO TRT-MDI/SPACER;   PROTOCOL DRIVEN: YES-MD INITIATED;   BREATH SOUNDS: APICES CLEAR/LLL DISTANT AND RLL A FEW RALES CLEAR;   MEDICATION #1: ALBUTEROL;   MED #1 AMOUNT: 2.0;   MED #1 UNIT: PUFFS;   MDI-LEARNING NEED: CORRECT USE OF MDI/SPACER/PF;   RSN NOT COMPLETED: PT. NOT REQUIRING TREATMENT;   ADVERSE REACTIONS: NONE;   ELECTRONIC SIGNATURE DERIVED FROM A SINGLE CONTROLLED ACCESS PASSWORD:   Shawn Mejia ; 07/21/2010 00:00:26

## 2010-07-21 NOTE — Procedures (Signed)
 PERFORMED ON - 07/21/2010 07:43:18;   DONE BY - HARRIS, SCOTT;  PROCEDURE - OXYGEN-LOW FLOW;   PROTOCOL DRIVEN: YES-MD INITIATED;   OXYGEN CHECK: DONE;   ADVERSE REACTIONS: NONE;   ELECTRONIC SIGNATURE DERIVED FROM A SINGLE CONTROLLED ACCESS PASSWORD:   [name] ; 07/21/2010 07:48:18

## 2010-07-21 NOTE — Plan of Care (Signed)
Problem: Pain - Acute  Goal: Communication of presence of pain  Outcome: Met  Pt is A&OX3. Pt c/o pain 7/10. Pt ordered Dilaudid, please see eMAR for time and dose of administration. Upon follow-up assessment pt states pain is 0/10. Pt verbalizes an understanding to call nurse for pain meds as needed. Will continue to monitor pain level.     Problem: Falls - Risk of  Goal: Absence of falls  Outcome: Met  Assess fall risk Q4h and prn. Call light is within reach. Bed rails up X2. Bed is in lowest position and locked. Non-skid footware in place. Pt is made aware to call nurse for assistance as needed. . Patient remains free from falls. Pt is A&OX3, verbalizes an understanding to call nurse for assistance as needed. Will continue to monitor.    Problem: Discharge Planning  Goal: Participation in care planning  Outcome: Not Met  Pt is A&OX3, verbalizes an understanding of the plan of care. All questions answered at this time. Pt is aware to call nurse as further questions arise. No plans for discharge thus far. Will continue to monitor and follow plan of care.  Goal: Verbalizes/demonstrates knowledge of discharge instructions  Outcome: Not Met  No plans for discharge thus far.   Goal: Able to perform ADL- independently or with minimal assist  Outcome: Not Met  No plans for discharge thus far.     Problem: Infection  Goal: Absence of infection signs and symptoms  Outcome: Met  Pt is A&OX3. VS:.BP 113/69  Pulse 84  Temp 98.1 F (36.7 C)  Resp 18  Ht 5' 10.5" (1.791 m)  Wt 95.7 kg (210 lb 15.7 oz)  BMI 29.84 kg/m2  SpO2 91%. Labs:.  Lab Results   Component Value Date     WBC 19.9 07/20/2010     RBC 4.22 07/20/2010     HGB 13.1 07/20/2010     HCT 38.7 07/20/2010     MCV 91.7 07/20/2010     MCHC 33.9 07/20/2010     RDW 13.5 07/20/2010     PLT 327 07/20/2010     MPV 9.8 07/20/2010     Pt verbalizes an understanding of signs of infection. No signs of infection noted to wound site at this time. Peripheral IV remains free from  drainage/redness. Will continue to monitor.     Problem: Breathing Pattern - Ineffective  Goal: Respiratory rate, rhythm and depth return to baseline  Outcome: Met  Pt is currently on room air  with O2 saturation of 90-92%( pt refuses 02). Lung sounds are clear.Pt is encouraged to splint incision with coughing. No respiratory distress noted. Will continue to monitor.  Goal: Arterial blood gas values and/or saturation return to baseline  Outcome: Not Met  No ABG's ordered or drawn thus far.

## 2010-07-21 NOTE — Plan of Care (Signed)
Problem: Pain - Acute  Goal: Communication of presence of pain  Outcome: Met  Patient A&O x 4, able to verbalized and communicate pain. Able to locate and describe pain. Uses verbal pain scale 0-10. Encouraged patient to verbalize pain and factors contributing to pain. Discussed pain relief options and goals. Discussed pain management plan. Patient verbalized understanding. Goal met.   Goal: Control of acute pain  Outcome: Met  Patient regularly communicates presence of pain and accurately uses Verbal Pain scale. Assess pain level using 0-10 pain scale Q4hrs and prn. Administer pain meds as needed. Encourage patient to reposition for comfort. Monitor RR and LOC. Pain reassessed 2 hours post PO pain medication administration and 1 hour post IV pain medication administration. See MAR and [facility] flowsheet. Will continue to monitor.  Goal: Knowledge of pain management methods  Outcome: Met  Discussed with patient the importance of recognizing and informing the nurse of acute pain onset before the pain becomes severe. Patient verbalized understanding. Will continue to monitor.   Goal: Response to pain management methods  Outcome: Met  See prior note    Problem: Pain - Chronic  Goal: Communication of presence of pain  Outcome: Met  See prior note.    Problem: Falls - Risk of  Goal: Absence of falls  Outcome: Met  Patient is assessed for falls. Fall risk moderate. Patient alert and oriented x 4. Call light within reach. Bed low and locked. Non-skid foot wear worn. Patient steady on his feet, ambulates in room and hallway. Patient experienced no falls and no physical injury. Will continue to monitor for changes.     Goal: Absence of physical injury  Outcome: Met  See prior note.  Goal: Knowledge of fall prevention  Outcome: Met  Discussed fall prevention with patient. Discussed risk for falls: medication, presence of tubes and lines. Asked patient to ambulate only with assistance. Patient stated understanding, and stated  compliance with fall prevention interventions.  Will reinforce and continue to monitor.     Problem: Infection  Goal: Absence of infection signs and symptoms  Outcome: Met   Assess and monitor for signs of infection.  Instruct patient to promptly report redness, warmth, discharge, pain, and increased body temperature. Review lab values (e.g., white blood cell count and differential, serum protein, serum albumin, and cultures). Patient verbalized understanding of signs of infection.  Patient is afebrile, WBC is 18.9. No signs of infection at incision site noted at this time.       Problem: Infection - Risk of, Surgical Site Infection  Goal: Absence of infection signs and symptoms  Outcome: Met  See prior note.  Goal: Able to perform proper infection control measures  Outcome: Met  Patient able to perform ADL's and proper hand washing. Discussed infection control measures. Patient verbalized understanding.  Goal: Glucose level within specified parameters  Outcome: Met  Assessed for signs and symptoms of hyper/hypo glycemia. Blood sugar checks AC and HS. Administered subcutaneous insulin coverage as ordered. Monitored nutrition intake. AM BS done by [facility] RN, required no insulin. Lunch BS 139, no insulin coverage administered per sliding scale. No signs or symptoms of hyper/hypo glycemia at this time. Will continue to monitor.      Problem: Breathing Pattern - Ineffective  Goal: Respiratory rate, rhythm and depth return to baseline  Outcome: Met  Assess respiratory status, monitor for signs and symptoms of hypoxia and monitor SaO2. Instruct patient to report any breathing difficulty including shortness of breath. Encouraged use of incentive  spirometer while awake. Lungs sounds are clear  Breathing in unlabored at rest.  O2 sat 94% this AM on room air. Patient denies breathing difficulty at this time and is splinting chest when coughing. No acute respiratory distress noted.   Goal: Arterial blood gas values and/or  saturation return to baseline  Outcome: Met  See prior note.

## 2010-07-21 NOTE — Procedures (Signed)
 PERFORMED ON - 07/20/2010 19:57:03;   DONE BY - GUDMUNDSSON, SCARLETT;  PROCEDURE - OXYGEN-LOW FLOW;   PROTOCOL DRIVEN: YES-MD INITIATED;   OXYGEN CHECK: DONE;   ADVERSE REACTIONS: NONE;   ELECTRONIC SIGNATURE DERIVED FROM A SINGLE CONTROLLED ACCESS PASSWORD:   Bennie Dallas ; 07/21/2010 00:00:03

## 2010-07-21 NOTE — Progress Notes (Signed)
Surgery Progress Note    Patient Name: Shawn Mejia MRN: [id number] Room#: 2263/2263    Date: 07/21/2010  Hospital Day:   7 days - Admitted on: 07/14/2010  Post Operative Day(s): 7 s/p Whipple    SUBJECTIVE: tolerating diet, pain controlled PO, passing flatus, no BM yesterday. SaO2 to 90% o/n without O2 supplement, saturating well on RA this AM.    OBJECTIVE:     Vitals signs:  Latest entry:   Temperature: 99.4 F (37.4 C)  Heart Rate: 89   Respirations: 16   Blood pressure (BP): 104/71 mmHg  SpO2: 94 %  Weight - scale: 93 kg (205 lb 0.4 oz)  Percentage Weight Change (%): -2.82 %    RANGE(last 24 hrs):  BP  Min: 104/71  Max: 128/79  Temp  Avg: 98.8 F (37.1 C)  Min: 98.1 F (36.7 C)  Max: 99.4 F (37.4 C)  Pulse  Avg: 86.3   Min: 75   Max: 97   Resp  Avg: 17.5   Min: 16   Max: 18   SpO2  Avg: 91.7 %  Min: 90 %  Max: 94 %      Intake/Output    Date 07/20/10 0700 - 07/21/10 0659 07/21/10 0700 - 07/22/10 0659    Time 5409-8119 1500-2259 2300-0659 Total 0700-1459 1500-2259 2300-0659 Total   INTAKE    P.O.   600   360     960                 Total Intake 600 360  960       OUTPUT    Total Output           NET I/O     600 360  960                          Diet  Soft mechanical      Labs:  CBC  Recent Labs   Basename 07/21/10 0620 07/20/10 0650   . WBC 18.9* 19.9*   . HGB 12.2* 13.1*   . HCT 36.5* 38.7*   . PLT 294 327   . BAND -- --   . SEG 67 73*   . LYMPHS 17* 15*   . MONOS 13* 10        Chemistry  Recent Labs   Basename 07/21/10 0620 07/20/10 1200 07/20/10 0650   . NA 137 -- 141   . K 3.7 -- 3.8   . CL 100 -- 103   . BICARB 29 -- 28   . BUN 18 -- 21*   . CREAT 0.81 -- 0.82   . GLU 110 -- 111   . [place] 8.3* -- 8.5*   . MG 1.8 -- 1.9   . PHOS 3.5 3.3 --   . IONCA -- -- --       Coags  Recent Labs   [facility] 07/21/10 0620 07/20/10 0650   . PT 12.9* 11.8   . PTT -- --   . INR 1.2 1.1     Physical Exam:  General Appearance: sitting comfortably, NAD   Heart: normal rate and regular rhythm, no murmurs, clicks, or gallops.   Lungs:  ctab, no w/c/r, saturating well on RA   Abdomen: soft, mild ttp, nondistended; incision c/d/i    Medications:  Current facility-administered medications   Medication   . magnesium hydroxide (MILK OF MAGNESIA) concentrated oral suspension 10 mL   .  morphine injection 1 mg   . ibuprofen (MOTRIN) tablet 400 mg   . HYDROmorphone (DILAUDID) tablet 2 mg   . HYDROmorphone (DILAUDID) tablet 4 mg   . magnesium hydroxide (MILK OF MAGNESIA) concentrated oral suspension 10 mL   . Gaba-KL 04-01-09%   . metoprolol succinate (TOPROL XL) XL tablet 25 mg   . pancrealipase (CREON 24000) DR capsule 2 capsule   . nortriptyline (PAMELOR) capsule 10 mg   . gabapentin (NEURONTIN) tablet 300 mg   . haloperidol lactate (HALDOL) injection 2.5 mg   . ipratropium-albuterol (DUO-NEB) 0.5-2.5 MG/3ML nebulizer solution 3 mL   . albuterol (PROVENTIL HFA) 108 (90 BASE) MCG/ACT inhaler 2-20 puff   . naloxone (NARCAN) injection 0.1 mg   . glucose chewable tablet 16 g   . glucose 40% oral gel 1 Tube   . dextrose 50 % solution 12.5 g   . glucagon (GLUCAGON) injection 1 mg   . enoxaparin (LOVENOX) injection 40 mg   . famotidine (PEPCID) IVPB 20 mg   . famotidine (PEPCID) tablet 20 mg   . metoclopramide (REGLAN) injection 10 mg   . insulin regular (HUMULIN,NOVOLIN) injection 1-10 Units   . DISCONTD: metoprolol (LOPRESSOR) injection 5 mg         ASSESSMENT / PLAN:   59 year old male POD7 s/p whipple for pancreatic head mass. Feels well, continued elevated WBC.    - cont dilaudid PO for pain  - WBC still elevated, afebrile, no s/s infection; concern for leak will get CT abd today.  - advance to regular diet  - no BM since yesterday, will try more MOM  - pulm toilet, OOB, IS    D/w Dr. Arrie Eastern, PGY1

## 2010-07-21 NOTE — Procedures (Signed)
 PERFORMED ON - 07/21/2010 07:35:52;   DONE BY - HARRIS, SCOTT;  PROCEDURE - PDP RE-EVALUATION;   NEURO STATUS: SLEEPING;   HEARTRATE: 89;   RESP RATE: 20;   CODE STATUS CHECKED: YES;   PRE-TITR FIO2 OR L/M: 2;   PRE-TITR SAO2: 89;   TITRATED FIO2 OR L/M: 4;   POST TITRATION SAO2: 92;   MD REQUEST #1: OXYGEN;   RCP RECOMMENDATION: #1: O2 PROTOCOL;   INDICATION: #1: O: TO KEEP SA02 >92;   EVALUATION OUTCOME: #1: AS RCP RECOMMENDED;   MD REQUEST #2: MDI PRN;   RCP RECOMMENDATION: #2: MDI PRN;   INDICATION: #2: B: PRN FOR SOB;   EVALUATION OUTCOME: #2: AS RCP RECOMMENDED;   MD REQUEST #3: SVN PRN;   RCP RECOMMENDATION: #3: SVN PROTOCOL;   INDICATION: #3: B: PRN FOR SOB;   EVALUATION OUTCOME: #3: AS RCP RECOMMENDED;   ADVERSE REACTIONS: NONE;   ELECTRONIC SIGNATURE DERIVED FROM A SINGLE CONTROLLED ACCESS PASSWORD:   [name] ; 07/21/2010 07:46:52

## 2010-07-22 MED ORDER — ESOMEPRAZOLE MAGNESIUM 40 MG OR CPDR
40.00 mg | DELAYED_RELEASE_CAPSULE | Freq: Two times a day (BID) | ORAL | Status: AC
Start: 2010-07-22 — End: ?

## 2010-07-22 MED ORDER — PANCRELIPASE (LIP-PROT-AMYL) 24000-76000 UNITS PO CPEP
2.00 | ORAL_CAPSULE | Freq: Three times a day (TID) | ORAL | Status: AC
Start: 2010-07-22 — End: ?

## 2010-07-22 MED ORDER — DOCUSATE SODIUM 250 MG OR CAPS
250.0000 mg | ORAL_CAPSULE | Freq: Two times a day (BID) | ORAL | Status: DC
Start: 2010-07-22 — End: 2011-01-17

## 2010-07-22 MED ORDER — HYDROMORPHONE HCL 2 MG OR TABS
1.0000 mg | ORAL_TABLET | ORAL | Status: DC | PRN
Start: 2010-07-22 — End: 2011-01-17

## 2010-07-22 NOTE — Discharge Summary (Signed)
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 Pancreas head tumor  2. Acute pulmonary edema  3. DM II  4. Tobacco use    Additional Hospital Diagnoses ("rule out" or "suspected" diagnoses, etc.):  None    Principal Procedure During This Hospitalization (required):  Pylorus-preserving pancreaticoduodenectomy, cholecystectomy, and insertion of foley catheter    Other Procedures Performed During This Hospitalization (required):  Pathology evaluation of pancreas head mass  CT abd/pelvis    Procedure results are available in Chart Review in Epic.  For those providers external to [place], the key procedure results are listed below:    Operation listed above performed without complication.    Pathology report:  A: Bile duct margin, excision  -Margin negative, see synoptic report.  B: Pancreatic margin, excision  -Margin negative, see synoptic report.  C: Gallbladder, cholecystectomy  -Benign gallbladder.  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  -Fifteen lymph nodes negative for malignancy (0/15).  -Final margins negative for malignancy, see comment.  -Duodenum with no significant histopathology.    CT abd/pelvis 07/21/2010:  1. Postoperative changes from Whipple procedure. There is moderate amount of   fluid in the surgical bed which may be postoperative. Correlate clinically for  pancreatitis. There is very mild enhancement surrounding the fluid and   superimposed infection is not excluded. No definite drainable fluid   collection.    2. Submucosal edema in the proximal jejunum at the choledochojejunal   anastomosis which is likely postoperative.    3. Sigmoid diverticulosis.    Consultations Obtained During This Hospitalization:  None    Key consultant recommendations:  n/a    Reason for  Admission to the Hospital / History of Present Illness:  Shawn Mejia is a 59 year old male who had undergone a CT scan to evaluate groin pain and was found to have a mass in the uncinate process of the pancreas. MRI was then performed and showed the mass as well as pancreatic ductal dilatation so he was scheduled for an operation and was admitted after his pancreaticoduodenectomy.    Hospital Course by Problem (required):  1. Pancreatic head tumor:  Shawn Mejia had his operation on 07/14/2010 without complication. He was admitted to the ICU post-operatively due to the extent of his procedure and his smoking history. He did well overnight and was transferred to the IMU POD1. Due to his history of smoking and acute pulmonary edema from operative fluid shifts, he had increased oxygen requirements POD2 and POD3. His saturations were maintained on a high flow mask with initial FiO2 90%; as he was diuresed with lasix over the next couple days he was progressively able to be weaned from his high flow mask to a nasal oximizer then nasal cannula by POD5. He was then able to be weaned off supplemental O2 entirely.    Of note, he did have an episode of delirium POD2 when he had increased O2 requirements and was on a basal rate of dilaudid PCA; he was taken off the basal dilaudid and he had no further episodes during hospitalization.    His bowel function expectantly returned during his hospital stay and his diet was advanced as he began passing gas. On POD7, after 2 days of persistent leukocytosis without fever, a CT scan was obtained to rule  out leak/abscess; the CT scan showed post-operative changes without evidence of a fluid collection able to be drained. His WBC was trending down on day of discharge.    By day of discharge he was tolerating a regular diet, passing flatus, on a stool softener, and ambulating. Pain was controlled on dilaudid PO and he was ready to go home. Follow up with Shawn Mejia in 1 week.    2. DM II: blood  sugars were controlled with sliding scale insulin while in house. He did not require much insulin and he will follow up with his primary care physician for long term glucose management. He we will restart his old medications at home.    4. Tobacco use: one component of his increased oxygen requirement while hospitalized was his underlying lung disease from smoking. He was advised daily to quit smoking both from his lungs and a general health standpoint. The risks of smoking were discussed at length prior to discharge.    Tests Outstanding at Discharge Requiring Follow Up:  None    Discharge Condition (required):  Improved.    Key Physical Exam Findings at Discharge:  Physical examination is signifcant for:  midline incision c/d/i.    Discharge Diet:  Regular.    Discharge Medications:  Current Discharge Medication List      START taking these medications       HYDROmorphone (DILAUDID) 2 MG tablet    Take 0.5 tablets by mouth every 3 hours as needed for Moderate Pain (Pain Score 4-6).    Qty: 40 tablet Refills: 0    Associated Diagnoses: Pancreatic mass        pancrealipase (CREON 24000) 24000 UNIT CPEP    Take 2 capsules by mouth 3 times daily (with meals).    Qty: 180 capsule Refills: 3    Associated Diagnoses: Pancreatic mass        docusate sodium (COLACE) 250 MG capsule    Take 1 capsule by mouth 2 times daily.    Qty: 100 capsule Refills: 0    Associated Diagnoses: Pancreatic mass          CONTINUE these medications which have CHANGED       esomeprazole (NEXIUM) 40 MG capsule    Take 1 capsule by mouth 2 times daily.    Qty: 30 capsule Refills: 2    Associated Diagnoses: Pancreatic mass          CONTINUE these medications which have NOT CHANGED       gabapentin-ketoprofen-lidocaine (GABA-KL) 04-01-09% compounded cream    Apply  topically 2 times daily. use thin layer to affected area. Dosed 6 hours apart, then withhold for 12 hours    Qty: 30 g Refills: 3    Associated Diagnoses: Neuropathy, ilioinguinal nerve;  Iliohypogastric nerve neuralgia        nortriptyline (PAMELOR) 10 MG capsule    Take 1 capsule by mouth nightly.    Qty: 30 capsule Refills: 2    Associated Diagnoses: Neuropathy, ilioinguinal nerve        gabapentin (NEURONTIN) 300 MG capsule    Take 1 capsule by mouth 3 times daily.    Qty: 90 capsule Refills: 2    Associated Diagnoses: Neuropathy, ilioinguinal nerve        ezetimibe-simvastatin (VYTORIN) 10-20 MG per tablet    Take 1 tablet by mouth every evening.        pioglitazone (ACTOS) 15 MG tablet    Take 15 mg by mouth daily.  Niacin 500 MG CPCR    Take 500 mg by mouth daily.        Omega-3 Fatty Acids (FISH OIL) 500 MG capsule    Take 1,000 mg by mouth daily. 1-2 tabs daily        aspirin (ASPIR-LOW) 81 MG EC tablet    Take 81 mg by mouth daily.        metoprolol succinate (TOPROL XL) 25 MG XL tablet    Take 25 mg by mouth daily.          STOP taking these medications       oxycodone-acetaminophen (PERCOCET) 7.5-500 MG per tablet    Comments:     Reason for Stopping:                 Allergies:  No Known Allergies    Discharge Disposition:  Home.    Discharge Code Status:  Full code / full care  This code status is not changed from the time of admission.    Follow Up Appointments:  Follow up with Shawn Mejia in 1 week    Scheduled appointments:  No future appointments.    For appointments requested for after discharge that have not yet been scheduled, refer to the Post Discharge Referrals section of the After Visit Summary.    Discharging Physician's Contact Information:  [facility] operator at [phone].

## 2010-07-22 NOTE — Procedures (Signed)
 PERFORMED ON - 07/22/2010 07:51:45;   DONE BY - ROMERO, JAIME;  PROCEDURE - PDP RE-EVALUATION;   NEURO STATUS: SLEEPING;   HEARTRATE: 78;   RESP RATE: 20;   CODE STATUS CHECKED: YES;   PRE-TITR FIO2 OR L/M: 2;   PRE-TITR SAO2: 92;   POST TITRATION SAO2: 92;   MD REQUEST #1: OXYGEN;   RCP RECOMMENDATION: #1: O2 PROTOCOL;   INDICATION: #1: O: TO KEEP SA02 >92;   EVALUATION OUTCOME: #1: AS RCP RECOMMENDED;   MD REQUEST #2: MDI PRN;   RCP RECOMMENDATION: #2: MDI PRN;   INDICATION: #2: B: PRN FOR SOB;   EVALUATION OUTCOME: #2: AS RCP RECOMMENDED;   MD REQUEST #3: SVN PRN;   RCP RECOMMENDATION: #3: SVN PROTOCOL;   INDICATION: #3: B: PRN FOR SOB;   EVALUATION OUTCOME: #3: AS RCP RECOMMENDED;   ADVERSE REACTIONS: NONE;   ELECTRONIC SIGNATURE DERIVED FROM A SINGLE CONTROLLED ACCESS PASSWORD:   Carolynn Serve ; 07/22/2010 11:47:45

## 2010-07-22 NOTE — Plan of Care (Signed)
Problem: Pain - Acute  Goal: Communication of presence of pain  Outcome: Met  Patient A&O x 4, able to verbalized and communicate pain. Able to locate and describe pain. Uses verbal pain scale 0-10. Encouraged patient to verbalize pain and factors contributing to pain. Discussed pain relief options and goals. Discussed pain management plan. Patient verbalized understanding. Goal met.  Goal: Control of acute pain  Outcome: Met  Patient regularly communicates presence of pain and accurately uses Verbal Pain scale. Assess pain level using 0-10 pain scale Q4hrs and prn. Administer pain meds as needed. Encourage patient to reposition for comfort. Monitor RR and LOC. Pain reassessed 2 hours post PO pain medication administration and 1 hour post IV pain medication administration. See MAR and [facility] flowsheet. PO Dilaudid given as ordered. Reduces pain to 0-1/10. Will continue to monitor.  Goal: Knowledge of pain management methods  Outcome: Met  Discussed with patient the importance of recognizing and informing the nurse of acute pain onset before the pain becomes severe. Patient verbalized understanding. Will continue to monitor.     Problem: Falls - Risk of  Goal: Absence of falls  Outcome: Met  Patient is assessed for falls. Fall risk moderate. Patient alert and oriented x 4. Call light within reach. Bed low and locked. Non-skid foot wear worn. Patient steady on his feet, ambulates in room and hallway. Patient experienced no falls and no physical injury. Will continue to monitor for changes.  Goal: Absence of physical injury  Outcome: Met  Patient experienced no physical injury.  Goal: Knowledge of fall prevention  Outcome: Met  Discussed fall prevention with patient. Discussed risk for falls: medication, presence of tubes and lines. Asked patient to ambulate only with assistance. Patient stated understanding, and stated compliance with fall prevention interventions.  Will reinforce and continue to monitor.     Problem:  Infection  Goal: Absence of infection signs and symptoms  Outcome: Met  Assess and monitor for signs of infection.  Instruct patient to promptly report redness, warmth, discharge, pain, and increased body temperature. Review lab values (e.g., white blood cell count and differential, serum protein, serum albumin, and cultures).  Patient verbalized understanding of signs of infection.  Patient is afebrile, WBC is 14.2. No signs of infection at incision site noted at this time.       Problem: Infection - Risk of, Surgical Site Infection  Goal: Absence of infection signs and symptoms  Outcome: Met  See prior note.   Goal: Able to perform proper infection control measures  Outcome: Met  Patient able to perform ADL's and proper hand washing. Knowledgeable of infection control measures.   Goal: Glucose level within specified parameters  Outcome: Met  Assessed for signs and symptoms of hyper/hypo glycemia. Blood sugar checks AC and HS. Administered subcutaneous insulin coverage as ordered. Monitored nutrition intake. AM BS 130. No insulin coverage administered. No signs or symptoms of hyper/hypo glycemia at this time. Will continue to monitor.      Problem: Breathing Pattern - Ineffective  Goal: Respiratory rate, rhythm and depth return to baseline  Outcome: Met   Assess and monitor breathing status. Patient lung sounds clear. Patient is on O2 at 2 liters via NC with saturation of 92%. Patient can desaturate in to the 80's. Was 94% on room air at 0400. Patient respirations even and non-labored. Patient denies cough and shortness of breath at this time. Will continue to monitor and assess for changes.   Goal: Arterial blood gas values and/or  saturation return to baseline  Outcome: Met  See prior note.

## 2010-07-22 NOTE — Plan of Care (Signed)
Problem: Pain - Acute  Goal: Communication of presence of pain  Outcome: Met  Pt is A&OX3. Pt c/o pain 7-8/10. Pt ordered Dilaudid,  please see eMARfor time and dose of administration. Upon follow-up assessment pt states pain is 3/10. Pt verbalizes an understanding to call nurse for pain meds as needed. Will continue to monitor pain level.     Problem: Falls - Risk of  Goal: Absence of falls  Outcome: Met  Assess fall risk Q4h and prn. Call light is within reach. Bed rails up X2. Bed is in lowest position and locked. Non-skid footware in place. Pt is made aware to call nurse for assistance as needed.  Patient remains free from falls. Pt is A&OX3, verbalizes an understanding to call nurse for assistance as needed.  Will continue to monitor.    Problem: Discharge Planning  Goal: Participation in care planning  Outcome: Not Met  Pt is A&OX3, verbalizes an understanding of the plan of care. All questions answered at this time. Pt is aware to call nurse as further questions arise. No plans for discharge thus far. Will continue to monitor and follow plan of care.   Goal: Verbalizes/demonstrates knowledge of discharge instructions  Outcome: Not Met  No plans for discharge thus far.   Goal: Able to perform ADL- independently or with minimal assist  Outcome: Not Met  No plans for discharge thus far.     Problem: Infection  Goal: Absence of infection signs and symptoms  Outcome: Not Met  Pt is A&OX3. VS:.BP 124/67  Pulse 106  Temp 99.2 F (37.3 C)  Resp 18  Ht 5' 10.5" (1.791 m)  Wt 93 kg (205 lb 0.4 oz)  BMI 29.00 kg/m2  SpO2 93%. Labs: .  Lab Results   Component Value Date     WBC 18.9 07/21/2010     RBC 3.99 07/21/2010     HGB 12.2 07/21/2010     HCT 36.5 07/21/2010     MCV 91.5 07/21/2010     MCHC 33.4 07/21/2010     RDW 13.4 07/21/2010     PLT 294 07/21/2010     MPV 9.6 07/21/2010      Pt verbalizes an understanding of signs of infection. No signs of infection noted to wound site at this time. Peripheral IV remains free  from drainage/redness. Will continue to monitor.     Problem: Infection - Risk of, Surgical Site Infection  Goal: Glucose level within specified parameters  Outcome: Met  Blood glucose was monitored and managed. Patient verbalized understanding of blood glucose monitoring. No signs and symptoms of hypoglycemia or hyperglycemia at this time    Problem: Breathing Pattern - Ineffective  Goal: Respiratory rate, rhythm and depth return to baseline  Outcome: Not Met  Pt is currently on 2 liters with O2 saturation of 96% Lung sounds are clear. t is encouraged to splint incision with coughing. No respiratory distress noted. Will continue to monitor.  Goal: Arterial blood gas values and/or saturation return to baseline  Outcome: Not Met  No ABG's drawn or ordered thus far.

## 2010-07-22 NOTE — Interdisciplinary (Signed)
07/22/2010 Discharged home, no needs. APerry RN,CCM

## 2010-07-22 NOTE — Progress Notes (Signed)
S: no complaints, pain controlled, tolerating reg diet, +flatus    O:  Temperature:  [98.5 F (36.9 C)-99.8 F (37.7 C)] 98.8 F (37.1 C) (09/30 0751)  Blood pressure (BP): (108-124)/(67-77) 124/76 mmHg (09/30 0751)  Heart Rate:  [87-106] 92  (09/30 0751)  Respirations:  [16-18] 16  (09/30 0751)  Pain Score:  [-] 1 (09/30 0751)  O2 Device:  [-] Nasal cannula (09/30 0751)  O2 Flow Rate (L/min):  [2 l/min] 2 l/min (09/30 0751)  SpO2:  [92 %-94 %] 92 % (09/30 0751)    PE:  Gen: NAD  CV: rrr, no m/g/r  Resp: ctab  Abd: soft, nt/nd; incision c/d/i    Labs:  Recent Labs   Basename 07/22/10 0615 07/21/10 0620   . 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 Doing well, WBC improving. CT yesterday without evidence of leak or drainable fluid collection.    -d/c home today  - f/u with Dr. Jetta Lout in 1-2 weeks    Jerolyn Center, PGY1

## 2010-07-22 NOTE — Progress Notes (Signed)
Loks great, CT negative and WBC down- D/C home today

## 2010-07-22 NOTE — Plan of Care (Signed)
Problem: Discharge Planning  Goal: Participation in care planning  Outcome: Met  Pt discharged to home, to be transported by daughter. Pt is A&OX3, VSS. Peripheral IV removed intact - no bleeding/hematoma noted. All discharge instructions reviewed with patient. Discussed with patient - diet, activity, medications, and pain control. All questions answered regarding discharge. Pt is aware of follow-up appointment. Influenza/PNA vaccines not indicated. Prescriptions called down to [facility]. Goal Met    Goal: Verbalizes/demonstrates knowledge of discharge instructions  Outcome: Met  Patient and wife verbalized understanding of discharge instructions.  Goal: Verbalize knowledge of prescribed medication management plan  Outcome: Met  Patient and wife verbalized understanding of prescribed medication management plan.  Goal: Able to perform ADL- independently or with minimal assist  Outcome: Met  Patient able to perform ADL's. Educated patient and wife of dressing changes. Supplies given.

## 2010-07-22 NOTE — Discharge Instructions (Addendum)
 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    What Happened During Your Hospital Stay    The main tests and treatments done for you during this hospitalization were:    Pylorus-preserving pancreatoduodenectomy, cholecystectomy, and insertion of foley catheter    The following evaluation is still important to complete after discharge from the hospital:  Follow up with Dr. Jetta Lout in 1-2 weeks.    Instructions for After Discharge    Your diet at home should be a regular diet.    Your activity level at home should be:  limited activity for 6 weeks, as specified below.    Specific activity restrictions:    Do not drive while taking narcotic pain medications.  Do not work with heavy or complex machinery while taking narcotic pain medications.  Do not lift more than 10 pounds for a period of 6 weeks.    Wound or tube care instructions:  Keep area clean and dry.  Do not submerge area under water for 21 days.  Place a dry dressing on the area and change dressing as often as needed.  You may use a heating pad on the area as needed for comfort.  You may use an ice pack on the area as needed for comfort.    Your medication list is located on this After Visit Summary in the Current Discharge Medication List section.  Your nurse will review this information with you before you leave the hospital.    It is very important for you to keep a current medication list with you in order to assist your doctors with your medical care.  Bring this After Visit Summary with you to your follow up appointments.    Reasons to Contact a Doctor Urgently    Call 911 or return to the hospital immediately if:  Increased or uncontrolled pain.  Severe abdominal pain.  Nausea and vomiting.  Severe abdominal bloating.  Jaundice  (yellowing of the skin and eyes).  Redness or swelling at the surgical (or wound) site.  Discharge or leakage from the surgical (or wound) site.  Fevers or chills.  Bleeding from surgical (or wound) site.  Shortness of breath or difficulty breathing.  Chest pains or palpitations.    You should contact either your primary care physician or your hospital physician for any of the following reasons: Increased or uncontrolled pain.  Severe abdominal pain.  Nausea and vomiting.  Severe abdominal bloating.  Jaundice (yellowing of the skin and eyes).  Redness or swelling at the surgical (or wound) site.  Discharge or leakage from the surgical (or wound) site.  Fevers or chills.  Bleeding from surgical (or wound) site.  Shortness of breath or difficulty breathing.  Chest pains or palpitations.    If you have any questions about your hospital care, your medications, or if you have new or concerning symptoms soon after going home from the hospital, and you need to contact your hospital physician, your hospital physician can be contacted in the following manner:  Surgery clinic office phone as provided to you prior to surgery.    Once you are able to see your primary care physician (PCP), your PCP will then be responsible for further medication refills, or appointment referrals.    What Needs to Happen  Next After Discharge -- Appointments and Follow Up    Any appointments already scheduled at [place] clinics will be listed in the Future Appointments section at the top of this After Visit Summary.  Any appointments that have been requested, but have not yet been scheduled, will be listed below that under Post Discharge Referrals.    Sometimes tests performed in the hospital do not yet have results by the time a patient goes home.  The following key tests will need to be followed up at your next appointment: None    Additional Information for You from your Case Manager and/or Social Worker (if applicable)        Additional  Information for You from your Inpatient Therapists (if applicable)        Additional Information for You from your Discharging Nurse (if applicable)

## 2010-07-22 NOTE — Interdisciplinary (Signed)
-  pt with pancreatic mass, s/p Whipple on 9/22, POD#8, diet advanced to mechanical soft on 9/27 and then regular on 9/29, tolerating PO with doc intake 3/4 x3, education addressed on 9/28, meds and labs reviewed, BS good control; pt remains at moderate nutrition risk r/t s/p GI surgery, but nutritionally stable now, no further intervention needed  C. Rayetta Pigg RD, CNSC, pager [phone]

## 2010-07-22 NOTE — Procedures (Signed)
PERFORMED ON - 07/22/2010 07:51:26;   DONE BY - ROMERO, JAIME;  PROCEDURE - OXYGEN-LOW FLOW;   PROTOCOL DRIVEN: YES-MD INITIATED;   OXYGEN CHECK: DONE;   ADVERSE REACTIONS: NONE;   COMMENT: 2 LPM 92%  ELECTRONIC SIGNATURE DERIVED FROM A SINGLE CONTROLLED ACCESS PASSWORD:   Carolynn Serve ; 07/22/2010 11:47:26

## 2010-07-26 ENCOUNTER — Telehealth (HOSPITAL_BASED_OUTPATIENT_CLINIC_OR_DEPARTMENT_OTHER): Payer: Self-pay | Admitting: Family

## 2010-07-26 NOTE — Telephone Encounter (Signed)
Patient's wife called stating patient was having "trouble going to the bathroom", stated he is on Dulcolax stool softener, but it was not helping. She is asking for alternative options.

## 2010-07-26 NOTE — Telephone Encounter (Signed)
Called pt and he had "success" moving his bowels. I discussed things they can do to keep his bowels moving including milk of mag, taking in enough fluids, etc. Pt is also almost out of pain medication. He is taking dilaudid 2mg  approximately 3 times a day. As such, Dr Jetta Lout wrote a script for pt for dilaudid 2mg  #60. Pt's daughter will pick it up. Lula Olszewski RN

## 2010-08-01 NOTE — Interdisciplinary (Signed)
Mr Sollenberger returns to office for post-op visit. 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 We discussed his pathology and follow up. Pt is feeling very well. He states he has an appetite and is eating without difficulty. He continues on pain medication with good relief. His bowels are moving without difficulty. He is overall recovering well from surgery. We will see him back in 1 month. Pt agreeable to plan. Lula Olszewski RN

## 2010-08-02 ENCOUNTER — Ambulatory Visit (HOSPITAL_BASED_OUTPATIENT_CLINIC_OR_DEPARTMENT_OTHER): Admitting: Surgical Oncology

## 2010-08-02 VITALS — BP 110/71 | HR 80 | Temp 98.1°F | Resp 21 | Ht 70.5 in | Wt 191.0 lb

## 2010-08-02 MED ORDER — ADVIL PM PO
ORAL | Status: DC
Start: ? — End: 2011-01-17

## 2010-08-02 NOTE — Progress Notes (Signed)
Surgical Oncology Clinic Note    The patient is a 59 year old man who is here for follow up after Whipple on 07-20-10 for what turned out to be IPMN.  He is doing well and without complaints.  His pain is well controlled.  No nausea/vomiting.  He is recovering well.  No fevers/chills.      Filed Vitals:    08/02/10 1007   BP: 110/71   Pulse: 80   Temp: 98.1 F (36.7 C)   TempSrc: Oral   Resp: 21   Height: 5' 10.5" (1.791 m)   Weight: 86.637 kg (191 lb)   SpO2: 94%       General: NAD AAOx3  Abdomen:  Soft, appropriate mild incisional tenderness, nondistended, incision healing well without signs of infection      PATH:  FINAL PATHOLOGIC DIAGNOSIS:  A: Bile duct margin, excision  -Margin negative, see synoptic report.  B: Pancreatic margin, excision  -Margin negative, see synoptic report.  C: Gallbladder, cholecystectomy  -Benign gallbladder.  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  -Fifteen lymph nodes negative for malignancy (0/15).  -Final margins negative for malignancy, see comment.  -Duodenum with no significant histopathology.  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 Please note, however, that cautery  artifact in specimen B somewhat limits interpretation.  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  -AJCC/ UICC  (pTNM) Stage: pTisN0  -Additional comments: Case discussed with Dr. Jetta Lout on 07/20/2010 (MP).        A/P:  59 year old man s/p Whipple for IPMN on 07-20-10 here for follow up  1.  Doing well overall  2.  Will plan for repeat CT scan in 6 months to follow his IPMN  3.  Follow up with Dr. Jetta Lout again in 4 weeks, he will probably return to work after that visit  4.  Staples removed  5.  Path results discussed with the patient and his wife, given a copy of his path results

## 2010-08-04 NOTE — Progress Notes (Signed)
I saw and examined Shawn Mejia along with Dr. Particia Nearing. I am in agreement with her description of the history, physical exam findings and her plan as described above.

## 2010-08-12 ENCOUNTER — Telehealth (INDEPENDENT_AMBULATORY_CARE_PROVIDER_SITE_OTHER): Payer: Self-pay | Admitting: Anesthesiology

## 2010-08-12 DIAGNOSIS — G5780 Other specified mononeuropathies of unspecified lower limb: Secondary | ICD-10-CM

## 2010-08-12 NOTE — Telephone Encounter (Signed)
Pt's wife is requesting for new Rx for the Gabapentin and Nortriptyline fax to [facility]: [phone].  She stated that they can get a 90 day supply for a 30 day supply which is cheaper for them. Wife would like for someone to call her prior to sending Rx to [facility] so she can explain further what they need.

## 2010-08-15 ENCOUNTER — Telehealth (HOSPITAL_BASED_OUTPATIENT_CLINIC_OR_DEPARTMENT_OTHER): Payer: Self-pay | Admitting: Family

## 2010-08-15 MED ORDER — GABAPENTIN 300 MG OR CAPS
900.00 mg | ORAL_CAPSULE | Freq: Three times a day (TID) | ORAL | Status: AC
Start: 2010-08-15 — End: ?

## 2010-08-15 NOTE — Telephone Encounter (Signed)
Pt called and states he has pain in his right back, right below his rib cage. He states it is only sore when he touches it. He states no fevers and no redness at the site. I advised pt that pain is never normal but that I could not explain why he has this pain as it seems to be localized. I advised him to try some Advil to see if this helps but to keep me posted if it doesn't go away or becomes worse. Pt verbalized understanding and states he is also going to see his primary care physician soon. He will see [place] back in 2 weeks or sooner if needed. Pt agreeable. Lula Olszewski RN

## 2010-08-15 NOTE — Telephone Encounter (Signed)
Contacted patient, he does not see his primary care physician for approximately 3 weeks and will need enough gabapentin to last until that visit. He did not initially recall that our clinic policy is for his primary care physician to manage prescriptions as she is not a [facility] affiliated physician, but he realizes that now and agrees to go to her for refills of his medication.     I have ordered gabapentin 900mg  tid for 20 days and sent it to his preferred pharmacy and informed him that this is a one time event. He concurs.    He does note an improvement in his groin pain since the Whipple procedure, but admits this may just be due to distracting pain form the surgery.

## 2010-08-15 NOTE — Telephone Encounter (Signed)
Dr. Robby Sermon will call patient to discuss plan for meds as he is outside patient. We provided a one time fill given the fact he was having surgery right away after the consult.

## 2010-08-30 ENCOUNTER — Ambulatory Visit (HOSPITAL_BASED_OUTPATIENT_CLINIC_OR_DEPARTMENT_OTHER): Admitting: Surgical Oncology

## 2010-08-30 VITALS — BP 131/80 | HR 79 | Temp 97.9°F | Resp 21 | Ht 70.5 in | Wt 194.0 lb

## 2010-08-30 NOTE — Interdisciplinary (Addendum)
Shawn Mejia returns to office for post-op visit. 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 Pt looks well and states he is feeling well. He is accompanied by his wife today. He is eating without difficulty and bowels are moving regularly. As such, pt will return to work 09/23/10. We will see him back in 4 months with imaging prior to that visit. Pt agreeable to plan. Lula Olszewski RN

## 2010-08-30 NOTE — Progress Notes (Signed)
Surgical Oncology Clinic Note:    S: Shawn Mejia is a 59 year old male presenting for his post-operative visit following a Whipple procedure on 07/14/10 for IPMN.  Since his operation, the patient has done well and has no major complaints.  He is eating well, having regular bowel movements, and denies fevers, chills, night sweats.    O: AVSS  Gen - alert and oriented, no apparent distress  CV - regular rate and rhythm  Pulm - clear to auscultation bilaterally  Abdomen - soft, non-distended, non-tender; incision healing well  Extremities - warm, well-perfused    Path:  FINAL PATHOLOGIC DIAGNOSIS:  A: Bile duct margin, excision  -Margin negative, see synoptic report.  B: Pancreatic margin, excision  -Margin negative, see synoptic report.  C: Gallbladder, cholecystectomy  -Benign gallbladder.  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  -Fifteen lymph nodes negative for malignancy (0/15).  -Final margins negative for malignancy, see comment.  -Duodenum with no significant histopathology.    A/P: 59 year old man doing well s/p Whipple procedure for TisN0M0 IPMN.  - CT abdomen/pelvis in 4 months  - return to clinic at that time    Evaluated with Dr. Jetta Lout.    Kylea Berrong S. Olen Cordial, MD  General Surgery Chief Resident  Department of Surgery  Pgr: [phone]  08/30/2010  11:26 AM

## 2010-08-30 NOTE — Patient Instructions (Signed)
To have a CT scan in 4 months

## 2010-08-30 NOTE — Progress Notes (Signed)
I saw and examined Shawn Mejia along with Dr. Olen Cordial. I am in agreement with his description of the history, physical exam findings and the plan as described above.

## 2010-12-05 ENCOUNTER — Encounter (HOSPITAL_BASED_OUTPATIENT_CLINIC_OR_DEPARTMENT_OTHER): Payer: Self-pay | Admitting: Family

## 2010-12-05 NOTE — Interdisciplinary (Signed)
Pt called and stated his stool is still "mushy" and that he is having gas that can clear a room. He states his stool is kind of greasy too. He increased his Creon to 3 with meals. He is eating 2 times a day. We discussed eating smaller more frequent meals and also discussed increasing his Creon to 4 pills with meals to see if it helps. Pt is also due for  A CT scan. As such, order placed for CT. Pt is requesting it be done at [facility] imaging. Lula Olszewski RN

## 2010-12-09 ENCOUNTER — Telehealth (HOSPITAL_BASED_OUTPATIENT_CLINIC_OR_DEPARTMENT_OTHER): Payer: Self-pay | Admitting: Family

## 2010-12-09 NOTE — Telephone Encounter (Signed)
Pt was switched off his former diabetic medication to Metformin 750 mg daily. Lula Olszewski RN

## 2011-01-17 ENCOUNTER — Ambulatory Visit (HOSPITAL_BASED_OUTPATIENT_CLINIC_OR_DEPARTMENT_OTHER): Admitting: Surgical Oncology

## 2011-01-17 VITALS — BP 111/69 | HR 71 | Temp 98.0°F | Resp 20 | Ht 70.5 in | Wt 176.0 lb

## 2011-01-17 MED ORDER — MULTIVITAL-M OR TABS: ORAL_TABLET | ORAL | Status: AC

## 2011-01-17 MED ORDER — METFORMIN HCL 750 MG OR TB24: 1000.00 mg | ORAL_TABLET | Freq: Every day | ORAL | Status: AC

## 2011-01-17 NOTE — Interdisciplinary (Addendum)
Shawn Mejia returns to office for follow up. He is 6 months s/p whipple for an IPMN. Pt states he is feeling very well. He looks great. His biggest complaint continues to be terrible gas "that can clear a room". He states he will be seeing a gastroenterologist in the next 2 weeks. His weight has been stable at 174-178 pounds. Dr Jetta Lout discussed getting repeat imaging in 1 year with pt, to which he is agreeable. As such, we will see him back in a year with repeat imaging. Lula Olszewski RN

## 2011-01-19 NOTE — Progress Notes (Signed)
Mr. Guaman returns following a Whipple procedure on 07/14/10 for IPMN. Since his last visit he has done very well with his only issue being excess gas despite adequate Creon. His weight is stable at 173-178 lbs.      Filed Vitals:    01/17/11 0846   BP: 111/69   Pulse: 71   Temp: 98 F (36.7 C)   TempSrc: Oral   Resp: 20   Height: 5' 10.5" (1.791 m)   Weight: 79.833 kg (176 lb)   SpO2: 98%       Gen - alert and oriented, no apparent distress   Abdomen - soft, non-distended, non-tender; incision well healed     Path:   FINAL PATHOLOGIC DIAGNOSIS:  A: Bile duct margin, excision  -Margin negative, see synoptic report.  B: Pancreatic margin, excision  -Margin negative, see synoptic report.  C: Gallbladder, cholecystectomy  -Benign gallbladder.  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  -Fifteen lymph nodes negative for malignancy (0/15).  -Final margins negative for malignancy, see comment.  -Duodenum with no significant histopathology.    A/P: Mr. Rossitto is doing well s/p Whipple procedure for TisN0M0 IPMN.   - CT abdomen/pelvis and return to see me in 1 year    He will see his gastroenterologist regarding excessive gas

## 2012-01-01 ENCOUNTER — Encounter (HOSPITAL_BASED_OUTPATIENT_CLINIC_OR_DEPARTMENT_OTHER): Payer: Self-pay | Admitting: Family

## 2012-01-02 ENCOUNTER — Telehealth (HOSPITAL_BASED_OUTPATIENT_CLINIC_OR_DEPARTMENT_OTHER): Payer: Self-pay | Admitting: Surgical Oncology

## 2012-01-15 NOTE — Interdisciplinary (Addendum)
Shawn Mejia returns to office for follow up and to discuss results of recent imaging. He is 1.5 years s/p whipple procedure on 07/14/10 for IPMN. He recently had a CT scan that showed:     "Again noted is a dilated main pancreatic duct measuring up to 7mm without significant change. There is thickening of peritoneal lining along the anterior abdominal wall with presence of a fluid collection measuring 7.0 X 1.4 X 5.8 cm that was not present on prior imaging.".    In the interim, pt has had hernia surgery done in Nov/12. He looks very well and states he is feeling well. As such, we will see him back in 1 year with imaging prior to that. Pt agreeable to plan. Shawn Olszewski RN

## 2012-01-16 ENCOUNTER — Ambulatory Visit (HOSPITAL_BASED_OUTPATIENT_CLINIC_OR_DEPARTMENT_OTHER): Payer: TRICARE Prime—HMO | Admitting: Surgical Oncology

## 2012-01-16 VITALS — BP 110/78 | HR 84 | Temp 98.0°F | Resp 18 | Ht 70.5 in | Wt 175.5 lb

## 2012-01-16 NOTE — Progress Notes (Signed)
[name] returns to the office 1.5 years s/p pancreaticoduodenectomy for IPMN without an invasive component. He continues to feel well and had no complaints today. He reports that he had a recent ventral hernia repair.    On eam he looks great.    Filed Vitals:    01/16/12 1017   BP: 110/78   Pulse: 84   Temp: 98 F (36.7 C)   TempSrc: Oral   Resp: 18   Height: 5' 10.5" (1.791 m)   Weight: 79.606 kg (175 lb 8 oz)   SpO2: 96%       Abd: soft, NDNT , incision looks well. No hernia, no masses.    Recent CT scan reveals stable PD dilation of 7 mm- anterior abdominal fluid collection likely postop    Impression: [name] has no evidence of recurrent IPMN    Plan: We will see him again and re-image again in 1 year.

## 2012-01-25 ENCOUNTER — Other Ambulatory Visit (HOSPITAL_BASED_OUTPATIENT_CLINIC_OR_DEPARTMENT_OTHER): Payer: Self-pay | Admitting: Surgical Oncology

## 2012-11-06 ENCOUNTER — Encounter: Payer: Self-pay | Admitting: Hospital

## 2013-01-07 ENCOUNTER — Encounter (HOSPITAL_BASED_OUTPATIENT_CLINIC_OR_DEPARTMENT_OTHER): Payer: Self-pay | Admitting: Family

## 2013-01-07 NOTE — Interdisciplinary (Signed)
Pt is due for imaging in follow up of his IPMN. As such orders placed. Pt will be having imaging at [facility] imaging. Lula Olszewski RN

## 2013-02-04 ENCOUNTER — Ambulatory Visit: Payer: TRICARE Prime—HMO | Attending: Surgical Oncology | Admitting: Surgical Oncology

## 2013-02-04 VITALS — BP 154/93 | HR 85 | Temp 98.3°F | Resp 18 | Ht 70.5 in | Wt 186.5 lb

## 2013-02-04 DIAGNOSIS — D490 Neoplasm of unspecified behavior of digestive system: Secondary | ICD-10-CM | POA: Insufficient documentation

## 2013-02-04 NOTE — Progress Notes (Signed)
Shawn Mejia returns to the office 2.5 years s/p pancreaticoduodenectomy for IPMN without an invasive component. He continues to feel well and had no complaints today. He reports that he had a recent repeat ventral hernia repair.    On eam he looks great.    Filed Vitals:    02/04/13 1022   BP: 154/93   Pulse: 85   Temp: 98.3 F (36.8 C)   TempSrc: Oral   Resp: 18   Height: 5' 10.5" (1.791 m)   Weight: 84.596 kg (186 lb 8 oz)   SpO2: 97%       Abd: soft, NDNT , incision looks well. No hernia, no masses.    Recent CT scan reveals no evidence of recurrent disease.    Impression: Mr. Gellert has no evidence of recurrent IPMN    Plan: We will see him again and re-image again in 1 year.

## 2013-02-04 NOTE — Patient Instructions (Signed)
Dr. Laurell Josephs Plan/Instructions: To return to office in 1 year with imaging prior.    Any questions/concerns/symptoms please call Dr. Laurell Josephs Nurse:   Josph Macho, RN, BSN OCN: [phone]   For any assistance with scheduling or insurance issues please call Dr. Laurell Josephs Administrative Assistant:   Alric Quan: [phone]     Business Hours: Monday - Friday 8:30am-4:30pm   Please note that Dr. Jetta Lout & Eunice Blase are usually in clinic all day on Tuesday.      For any urgent symptoms that happen after hours (Weekends & Monday - Friday before 8:30am and after 4:30pm) please call the On-Call Surgical Oncologist at: [phone]

## 2013-02-05 NOTE — Interdisciplinary (Signed)
Shawn Mejia returns to office for follow up. 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 Shawn Mejia had recent imaging that was unremarkable. He states he is doing very well and states no problems. Shawn Mejia expressed appreciation for the care received from Dr. Jetta Lout. We will see Shawn Mejia back in 6 months with imaging prior. Shawn Mejia agreeable to plan. Lula Olszewski RN

## 2013-02-14 ENCOUNTER — Other Ambulatory Visit (HOSPITAL_BASED_OUTPATIENT_CLINIC_OR_DEPARTMENT_OTHER): Payer: Self-pay | Admitting: Surgical Oncology

## 2014-01-04 ENCOUNTER — Encounter (HOSPITAL_BASED_OUTPATIENT_CLINIC_OR_DEPARTMENT_OTHER): Payer: Self-pay | Admitting: Family

## 2014-01-04 DIAGNOSIS — D490 Neoplasm of unspecified behavior of digestive system: Principal | ICD-10-CM

## 2014-01-04 NOTE — Progress Notes (Signed)
Patient called and is due for imaging. As such, orders placed. Evette Cristal NP

## 2014-04-07 ENCOUNTER — Other Ambulatory Visit (HOSPITAL_BASED_OUTPATIENT_CLINIC_OR_DEPARTMENT_OTHER): Payer: Self-pay | Admitting: Surgical Oncology

## 2014-05-04 NOTE — Patient Instructions (Signed)
Dr. Lowy's Plan/Instructions: To return to office in 1 year with imaging prior    Any questions/concerns/symptoms please call Dr. Lowy's Nurse Practitioner:  Riaz Onorato, NP, MSN OCN: [phone]     For any assistance with scheduling or insurance issues please call Dr. Lowy's Administrative Assistant:   Kate Trulock: [phone]     Business Hours: Monday - Friday 8:30am-4:30pm   Please note that Dr. Lowy & Jahnae Mcadoo are usually in clinic all day on Tuesday.      For any urgent symptoms that happen after hours (Weekends & Monday - Friday before 8:30am and after 4:30pm) please call the On-Call Surgical Oncologist at: [phone]

## 2014-05-04 NOTE — Interdisciplinary (Addendum)
Surgical Oncology Follow-Up Note  Evette Cristal NP, MSN, OCN    S: Shawn Mejia returns to office for follow up. 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 Pt had recent imaging done on 04/07/14 that was unremarkable. Patient looks well and states he is feeling very good with no complaints.    O: Blood pressure 128/76, pulse 47, temperature 97.6 F (36.4 C), temperature source Oral, resp. rate 16, height 5\' 10"  (1.778 m), weight 88.86 kg (195 lb 14.4 oz), SpO2 96 %.    A&P: Shawn Mejia is nearly 4 years out from a whipple procedure done for an IPMN. He is doing very well. He continues to take his Creon usually 3-4 with meals. He does have occasional symptoms of pancreatic insufficiency. We discussed the need for increasing his Creon based on what he is eating, especially high protein, high fat foods. Spent much of this appointment counseling patient on dietary and lifestyle changes including abstinence from smoking, weight loss and increasing his exercise regimen. Patient verbalized understanding and is agreeable with trying to make some changes. We will see him back in 1 year with imaging prior.    Patient seen and examined with Dr Shawna Orleans. All questions/concerns addressed today in clinic.    Evette Cristal NP   Nurse Practitioner to Dr Annita Brod  Division of Surgical Oncology

## 2014-05-05 ENCOUNTER — Ambulatory Visit: Payer: TRICARE Prime—HMO | Attending: Surgical Oncology | Admitting: Surgical Oncology

## 2014-05-05 VITALS — BP 128/76 | HR 47 | Temp 97.6°F | Resp 16 | Ht 70.0 in | Wt 195.9 lb

## 2014-05-05 DIAGNOSIS — D490 Neoplasm of unspecified behavior of digestive system: Principal | ICD-10-CM | POA: Insufficient documentation

## 2014-05-05 NOTE — Progress Notes (Signed)
I saw and examined Shawn Mejia along with Dr. Fabio Neighbors. I am in agreement with her assessment and plan as described above.

## 2014-05-05 NOTE — Progress Notes (Signed)
Surgical Oncology Follow-up    HPI:  33M with IPMN s/p pylorus preserving whipple 06/2010 presenting for routine surveillance. Has no complaints.  Doing well. Denies abdominal pain.  Eating well.  Normal daily bowel movements.    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      esomeprazole (NEXIUM) 40 MG capsule Take 1 capsule by mouth 2 times daily. 30 capsule 2    ezetimibe-simvastatin (VYTORIN) 10-20 MG per tablet Take 1 tablet by mouth every evening.      gabapentin (NEURONTIN) 300 MG capsule Take 3 capsules by mouth 3 times daily. 180 capsule 0    metformin (GLUCOPHAGE XR) 750 MG XR tablet Take 1,000 mg by mouth daily.      Multiple Vitamins-Minerals (MULTIVITAL-M) TABS       [DISCONTINUED] Niacin 500 MG CPCR Take 500 mg by mouth daily.      nortriptyline (PAMELOR) 10 MG capsule Take 1 capsule by mouth nightly. 30 capsule 2    Omega-3 Fatty Acids (FISH OIL) 500 MG capsule Take 1,000 mg by mouth daily. 1-2 tabs daily      pancrealipase (CREON 24000) 24000 UNIT CPEP Take 2 capsules by mouth 3 times daily (with meals). (Patient taking differently: Take 4-5 capsules by mouth 3 times daily (with meals).) 180 capsule 3     No current facility-administered medications on file prior to visit.     Review of systems:  Negative except as per HPI    Physical Exam:  4    05/05/14  1346   BP: 128/76   Pulse: 47   Temp: 97.6 F (36.4 C)   Resp: 16   SpO2: 96%     General:  no acute distress  Heart: bradycardic, regular rhythm  Lungs: Nonlabored breathing on room air  Abdomen: soft nontender nondistended    CT scan:  No evidence of disease    Assessment/Plan:  46M with IPMN s/p pylorus preserving whipple 06/2010 presenting for routine surveillance. Doing well.  No evidence of disease on imaging.  Follow-up 1 year with repeat imaging.    Seen and d/w Dr. Roslynn Amble Hosseini  PGY5

## 2015-02-09 ENCOUNTER — Encounter (HOSPITAL_BASED_OUTPATIENT_CLINIC_OR_DEPARTMENT_OTHER): Payer: Self-pay | Admitting: Family

## 2015-02-09 DIAGNOSIS — D490 Neoplasm of unspecified behavior of digestive system: Principal | ICD-10-CM

## 2015-07-13 ENCOUNTER — Ambulatory Visit (INDEPENDENT_AMBULATORY_CARE_PROVIDER_SITE_OTHER): Payer: TRICARE Prime—HMO | Admitting: Neurosurgery

## 2015-07-13 VITALS — BP 130/80 | HR 84 | Temp 98.4°F | Resp 18 | Ht 70.5 in | Wt 190.0 lb

## 2015-07-13 DIAGNOSIS — M47812 Spondylosis without myelopathy or radiculopathy, cervical region: Principal | ICD-10-CM

## 2015-07-13 NOTE — Progress Notes (Signed)
NEUROSURGERY OFFICE CONSULTATION    Shawn Mejia presents today for new patient evaluation and neurosurgical consultation, referred by Dr. Tyrone Apple.     Patient has chronic history is primarily neck pain, starting approximately 2 years ago. Patient was previously evaluated last year with CT and MRI of the cervical spine. Patient presents today with worsening pain, in spite of physical therapy and cervical pain injection therapy. Patient has rare numbness and tingling down the arms. Denies any weakness in the arms or hands or in the legs. He states his pain is on average 5/10 with days up to 7-8/10. Pain is exacerbated by any neck turning, either lateral rotation or backwards extension. Of note pain injection therapy was with Dr. Crist Infante of neurosurgery, which was unsuccessful. Patient has long-standing tobacco smoking history and failed Chantix in the past, current smoker with 50-pack-year history. Denies any bowel bladder changes.    No Known Allergies    Current Outpatient Prescriptions   Medication Sig Dispense Refill    aspirin (ASPIR-LOW) 81 MG EC tablet Take 81 mg by mouth daily.      esomeprazole (NEXIUM) 40 MG capsule Take 1 capsule by mouth 2 times daily. 30 capsule 2    ezetimibe-simvastatin (VYTORIN) 10-20 MG per tablet Take 1 tablet by mouth every evening.      gabapentin (NEURONTIN) 300 MG capsule Take 3 capsules by mouth 3 times daily. 180 capsule 0    metformin (GLUCOPHAGE XR) 750 MG XR tablet Take 1,000 mg by mouth daily.      Multiple Vitamins-Minerals (MULTIVITAL-M) TABS       nortriptyline (PAMELOR) 10 MG capsule Take 1 capsule by mouth nightly. 30 capsule 2    Omega-3 Fatty Acids (FISH OIL) 500 MG capsule Take 1,000 mg by mouth daily. 1-2 tabs daily      pancrealipase (CREON 24000) 24000 UNIT CPEP Take 2 capsules by mouth 3 times daily (with meals). (Patient taking differently: Take 4-5 capsules by mouth 3 times daily (with meals).) 180 capsule 3     No current facility-administered  medications for this visit.        Patient Active Problem List   Diagnosis    Pancreatic mass    Neuropathy, ilioinguinal nerve    Iliohypogastric nerve neuralgia    Intraductal papillary mucinous neoplasm of pancreas       Past Medical History   Diagnosis Date    Basal cell carcinoma nos 2009    Hyperlipidemia     Pneumonia     Type II or unspecified type diabetes mellitus without mention of complication, not stated as uncontrolled 1999       Past Surgical History   Procedure Laterality Date    Pb appendectomy  1982    Compound fracture right arm  1968    Carpal tunnel- bilateral  2003    Open inguinal hernia repair, bilateral  2002    Right open inguinal hernia repair  2003    Right open inguinal hernia repair  2009    Right open inguinal hernia repair  2010       Family History   Problem Relation Age of Onset    Heart Disease Father        Social History     Social History    Marital status: Married     Spouse name: N/A    Number of children: N/A    Years of education: N/A     Occupational History    Radio producer  Social History Main Topics    Smoking status: Current Every Day Smoker     Packs/day: 1.00     Years: 30.00     Types: Cigarettes    Smokeless tobacco: Never Used    Alcohol use Yes    Drug use: Not on file    Sexual activity: Not on file     Other Topics Concern    Not on file     Social History Narrative     BP 130/80  Pulse 84  Temp 98.4 F (36.9 C)  Resp 18  Ht 5' 10.5" (1.791 m)  Wt 86.2 kg (190 lb)  BMI 26.88 kg/m2  NEUROLOGIC EXAMINATION:   Sensorium:   Awake, alert, pleasant, and cooperative.    Cranial nerves: Grossly intact     Sensory: Normal (light touch).    Motor:  Normal bulk, tone.  No rigidity.     MOTOR STRENGTH        Upper extremeties: RUE LUE Lower extremities: RLE LLE   Deltoid 5/5 5/5 Iliopsoas 5/5 5/5   Biceps 5/5 5/5 Quadriceps 5/5 5/5   Triceps 5/5 5/5 Biceps Femoris 5/5 5/5   Wrist extension 5/5 5/5      Hand grip 5/5 5/5 Tibialis anterior 5/5  5/5   PIP extension 5/5 5/5 Gastrocnemius 5/5 5/5   Intrinsics 5/5 5/5 EHL 5/5 5/5     Patient with a 4+ in the right Achilles and 3+ in the left Achilles. Upgoing toe on the right with 1 beat clonus.  Hyperreflexia, brisk reflexes bilateral biceps 4+, bilateral triceps 3+.  No clear evidence of Hoffmann's.  Deep tendon reflexes 3+ in the bilateral patellar's.     Patient with limited active range of motion due to pain  Gait is normal.    Imaging reviewed:  Subsequent pain. Negative Spurling's. Bilateral rotation, 15 prior to pain. Flexion is full, with limited extension to 20    MRI of the cervical spine dated last year from May 11, 2014 was reviewed. There shows 1-2 mm of retrolisthesis of C6 on 7. There is multilevel of mild to mild-to-moderate spinal stenosis from C3-C7. There is associated multilevel foraminal stenosis bilaterally, of mild severity. Patient had subsequent CT of the cervical spine without IV contrast September 2015 from [facility] radiology. This shows primarily disc calcification at C5-6 and C6-7 without clear ossification of the posterior longitudinal ligament. Anterior osteophytes also present. There is anterolisthesis at C4-5 measuring up to 2 mm.     Assessment and plan:  Cervical spondylosis, degenerative with chronic neck pain primarily, and some evidence of clinical early myelopathy and upper motor neuron changes. Due to the progressive symptoms and dated imaging, would like to repeat MRI of the cervical spine without contrast as well as cervical CT without contrast. The CT will evaluate for possible calcifications of the posterior longitudinal ligament.  Additionally, cervical flexion extension x-rays will help determine whether or not the spine is unstable in setting of his spondylolisthesis.  I will see him back In several weeks after the new images studies are completed. I also counseled him extensively regarding smoking sensation as any potential spinal surgery requiring fusion would  be adversely affected. He voiced understanding.     I discussed with [name] the clinical diagnosis and prognosis, the imaging studies, and reviewed the management options.  Majority of time spent with counseling.  Time spent (45) minutes.    Thank you, Dr. Maylon Peppers, Rhyl for this opportunity to participate in the care  of this pleasant patient.

## 2015-07-16 ENCOUNTER — Encounter (INDEPENDENT_AMBULATORY_CARE_PROVIDER_SITE_OTHER): Payer: Self-pay | Admitting: Neurosurgery

## 2015-09-06 NOTE — Progress Notes (Signed)
Neurosurgery History and Physical Exam    Chief Complaint:  Cervical Spondylosis     History of Present Illness:     Shawn Mejia is a 64 year old male with a past history significant for cervical spondylosis, degenerative with chronic neck pain primarily, and some evidence of clinical early myelopathy and upper motor neuron changes. He presents to clinic today for routine follow up evaluation with new MRI cervical spine, CT cervical spine and flexion/extension cervical xrays.    He notes continued neck pain which is progressively getting worse. He denies any bladder/bowel changes. He does report issues walking a straight line.     Patient notes he is no longer smoking. He quit October 2nd.         Past Medical and Surgical History:  Past Medical History   Diagnosis Date    Basal cell carcinoma nos 2009    Hyperlipidemia     Pneumonia     Type II or unspecified type diabetes mellitus without mention of complication, not stated as uncontrolled 1999     Past Surgical History   Procedure Laterality Date    Pb appendectomy  1982    Compound fracture right arm  1968    Carpal tunnel- bilateral  2003    Open inguinal hernia repair, bilateral  2002    Right open inguinal hernia repair  2003    Right open inguinal hernia repair  2009    Right open inguinal hernia repair  2010       Allergies:  No Known Allergies    Medications:  Current Outpatient Prescriptions   Medication Sig    aspirin (ASPIR-LOW) 81 MG EC tablet Take 81 mg by mouth daily.    esomeprazole (NEXIUM) 40 MG capsule Take 1 capsule by mouth 2 times daily.    ezetimibe-simvastatin (VYTORIN) 10-20 MG per tablet Take 1 tablet by mouth every evening.    gabapentin (NEURONTIN) 300 MG capsule Take 3 capsules by mouth 3 times daily.    metformin (GLUCOPHAGE XR) 750 MG XR tablet Take 1,000 mg by mouth daily.    Multiple Vitamins-Minerals (MULTIVITAL-M) TABS     nortriptyline (PAMELOR) 10 MG capsule Take 1 capsule by mouth nightly.    Omega-3  Fatty Acids (FISH OIL) 500 MG capsule Take 1,000 mg by mouth daily. 1-2 tabs daily    pancrealipase (CREON 24000) 24000 UNIT CPEP Take 2 capsules by mouth 3 times daily (with meals). (Patient taking differently: Take 4-5 capsules by mouth 3 times daily (with meals).)     No current facility-administered medications for this visit.        Social History:  Social History     Social History    Marital status: Married     Spouse name: N/A    Number of children: N/A    Years of education: N/A     Occupational History    Radio producer      Social History Main Topics    Smoking status: Current Every Day Smoker     Packs/day: 1.00     Years: 30.00     Types: Cigarettes    Smokeless tobacco: Never Used    Alcohol use Yes    Drug use: Not on file    Sexual activity: Not on file     Other Topics Concern    Not on file     Social History Narrative       Family History:  Family History   Problem  Relation Age of Onset    Heart Disease Father         Review of Systems:  Per HPI, All other review of systems were discussed and reported negative    Physical Exam:   09/07/15  1124   BP: 136/90   Pulse: 88   Resp: 15   Temp: 98.6 F (37 C)       General: The patient is awake, alert, and in no acute distress.     HEENT: Head is normocephalic, atraumatic, conjunctivae without icterus, oropharynx is patent without erythema or edema.   Neck: Supple, non tender c-spine, trachea is midline. Full range of motion  CV: Heart with regular rate and rhythm; with no murmurs, gallops or rubs. No carotid bruits auscultated bilaterally.   Lungs: Clear to auscultation throughout  Extremities: No clubbing, cyanosis, or edema    Neurological examination:   Mental status: The patient is alert & oriented x3.   Intact recent and remote memory. Appropriate mood and affect.   Normal attention span and concentration.   Speech and language intact, no hoarseness or stridor     Cranial Nerves: Pupils are equal, round and reactive to light and  accommodation bilaterally, extraocular movements intact, fundoscopic exam reveals crisp disc margins, no nystagmus noted, visual field intact to confrontation.  Face is symmetric, no ptosis noted, hearing intact to finger rub, tongue and uvula is midline  Normal shoulder shrug and sternocleidomastoid strength   Normal tone with 5/5 strength in upper/lower extremities bilaterally    Sensory intact throughout   Intact finger-to-nose     Patient with a 4+ in the right Achilles and 3+ in the left Achilles. Upgoing toe on the right with 1 beat clonus.  Hyperreflexia, brisk reflexes bilateral biceps 4+, bilateral triceps 3+.  No clear evidence of Hoffmann's.  Deep tendon reflexes 3+ in the bilateral patellar's.      Imaging:  [facility]iology reviewed online by Dr. Alfonse Spruce.   MRI and CT cspine    Recommendations:  64 year old male with history of cervical spondylosis, degenerative with chronic neck pain primarily, and some evidence of clinical early myelopathy and upper motor neuron changes.    - Recommend anterior C6 corpectomy and C4-5 discectomy with C4-7 anterior fusion.   - Procedure discussed in detail today.   - Recommend pre procedure blood work within 30 days.   - Recommend pre op clearance with PCP.   - All questions answered today.     The patient was seen and examined with Dr. Alfonse Spruce.     Christino Mcglinchey K. Mee Hives, PA-C  Neurovascular Neurosurgery   Email: [email or web address]  Cell: [phone]

## 2015-09-07 ENCOUNTER — Ambulatory Visit (INDEPENDENT_AMBULATORY_CARE_PROVIDER_SITE_OTHER): Payer: TRICARE Prime—HMO | Admitting: Neurosurgery

## 2015-09-07 VITALS — BP 136/90 | HR 88 | Temp 98.6°F | Resp 15 | Ht 70.0 in | Wt 194.0 lb

## 2015-09-07 DIAGNOSIS — M47892 Other spondylosis, cervical region: Principal | ICD-10-CM

## 2015-09-07 NOTE — Patient Instructions (Signed)
Recommend proceed with surgery with Dr. Alfonse Spruce.  Please call [facility] to schedule your surgery at [phone].    Recommend pre procedure blood work.     Recommend pre op anesthesia appointment.     Please call [phone] to schedule your follow-up appointment.

## 2015-09-09 LAB — EMMI , ANESTHESIA (ADULT): EMMI Video Order Number: 13501103793

## 2015-09-15 ENCOUNTER — Telehealth (INDEPENDENT_AMBULATORY_CARE_PROVIDER_SITE_OTHER): Payer: Self-pay | Admitting: Neurosurgery

## 2015-09-15 NOTE — Telephone Encounter (Signed)
Called patient and LVM for surgery scheduling. Faxed all pre-op orders to PCP for surgery clearance. Left my callback information for confirmation.

## 2015-09-21 ENCOUNTER — Encounter (INDEPENDENT_AMBULATORY_CARE_PROVIDER_SITE_OTHER): Payer: Self-pay | Admitting: Nurse Practitioner

## 2015-09-21 DIAGNOSIS — M5030 Other cervical disc degeneration, unspecified cervical region: Principal | ICD-10-CM

## 2015-09-21 NOTE — Progress Notes (Signed)
ATTENDING NEUROSURGERY NOTE    Patient seen and examined today by myself with the PA.  Patient's exam and case reviewed.  I agree with the assessment and management plan as stated by the note by Katherine Mantle.    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  Recommend C6 corpectomy and C4-5 anterior cervical discectomy with fusion from C4 to C7.  Reviewed R/B/A including possibility of posterior instrumented fusion.    Thank you, Dr. Maylon Peppers, Rhyl for this opportunity to participate in the care of this pleasant patient.  Portions of this report were dictated using voice recognition software and even though it has been reviewed, there may be syntax/dictation errors present.  Please contact the [facility] if there is any question, concern or need for clarification.  I discussed with Kymon the clinical diagnosis and prognosis, the imaging studies, and reviewed the management options.  Majority of time spent with counseling.  Time spent (25) minutes.

## 2015-09-22 ENCOUNTER — Encounter (INDEPENDENT_AMBULATORY_CARE_PROVIDER_SITE_OTHER): Payer: Self-pay | Admitting: Nurse Practitioner

## 2015-09-22 DIAGNOSIS — M5030 Other cervical disc degeneration, unspecified cervical region: Principal | ICD-10-CM

## 2015-09-23 ENCOUNTER — Ambulatory Visit (INDEPENDENT_AMBULATORY_CARE_PROVIDER_SITE_OTHER): Payer: TRICARE Prime—HMO | Admitting: Neurosurgery

## 2015-09-23 VITALS — BP 135/80 | HR 77 | Temp 98.4°F | Resp 14 | Ht 70.0 in | Wt 190.0 lb

## 2015-09-23 DIAGNOSIS — M4722 Other spondylosis with radiculopathy, cervical region: Principal | ICD-10-CM

## 2015-09-23 NOTE — Progress Notes (Signed)
FOLLOW-UP VISIT: Pre op visit and Neck Pain    Shawn Mejia presents for pre-op eval prior to his upcomming surgery. He continues to note severe shocking neck and arm numbness.      PREVIOUS SURGICAL HISTORY: Shawn Mejia  has a past surgical history that includes Appendectomy (1982); compound fracture right arm (1968); carpal tunnel- bilateral (2003); open inguinal hernia repair, bilateral (2002); right open inguinal hernia repair (2003); right open inguinal hernia repair (2009); and right open inguinal hernia repair (2010).     CURRENT MEDICATIONS:   aspirin (ASPIR-LOW) 81 MG EC tablet Take 81 mg by mouth daily.   esomeprazole (NEXIUM) 40 MG capsule Take 1 capsule by mouth 2 times daily.   ezetimibe-simvastatin (VYTORIN) 10-20 MG per tablet Take 1 tablet by mouth every evening.   gabapentin (NEURONTIN) 300 MG capsule Take 3 capsules by mouth 3 times daily.   metformin (GLUCOPHAGE XR) 750 MG XR tablet Take 1,000 mg by mouth daily.   Multiple Vitamins-Minerals (MULTIVITAL-M) TABS    nortriptyline (PAMELOR) 10 MG capsule Take 1 capsule by mouth nightly.   Omega-3 Fatty Acids (FISH OIL) 500 MG capsule Take 1,000 mg by mouth daily. 1-2 tabs daily   pancrealipase (CREON 24000) 24000 UNIT CPEP Take 2 capsules by mouth 3 times daily (with meals). (Patient taking differently: Take 4-5 capsules by mouth 3 times daily (with meals).)       ALLERGIES: Shawn Mejia has No Known Allergies.    NEUROLOGIC EXAMINATION:   Cranial Nerves: Pupils are equal, round and reactive to light and accommodation bilaterally, extraocular movements intact, fundoscopic exam reveals crisp disc margins, no nystagmus noted, visual field intact to confrontation.  Face is symmetric, no ptosis noted, hearing intact to finger rub, tongue and uvula is midline  Normal shoulder shrug and sternocleidomastoid strength   Normal tone with 5/5 strength in upper/lower extremities bilaterally    Sensory intact throughout   Intact finger-to-nose     Patient with a 4+ in the right  Achilles and 3+ in the left Achilles. Upgoing toe on the right with 1 beat clonus.  Hyperreflexia, brisk reflexes bilateral biceps 4+, bilateral triceps 3+.  No clear evidence of Hoffmann's.  Deep tendon reflexes 3+ in the bilateral patellar's.     RADIOGRAPHIC REVIEW:   MRI of the cervical spine dated last year from May 11, 2014 was reviewed. There shows 1-2 mm of retrolisthesis of C6 on 7. There is multilevel of mild to mild-to-moderate spinal stenosis from C3-C7. There is associated multilevel foraminal stenosis bilaterally, of mild severity. Patient had subsequent CT of the cervical spine without IV contrast September 2015 from [facility] radiology. This shows primarily disc calcification at C5-6 and C6-7 without clear ossification of the posterior longitudinal ligament. Anterior osteophytes also present. There is anterolisthesis at C4-5 measuring up to 2 mm.     ASSESSMENT/PLAN: Shawn Mejia returns today for pre-op eval. We are planning to admit him to [facility] where he will undergo C6 corpectomy and C4-5 anterior cervical discectomy with fusion from C4 to C7.    The risks, benefits, possible outcomes, and treatment alternatives of this type of surgery with discussed with the patient. These risks include but are not limited to death, infection, coma, trouble swallowing or horsness,  loss of bowel/bladder or sexual dysfunction, CSF leak, nerve injury, weakness, pain, numbness, failure to improve despite surgery, recurrent problem or need for further surgery, instrumentation failure or loosening (if placed), psuedoarthrosis, adjacent level disease, DVT, PE, pneumonia, and anesthetic risks.  The patient verbalized understanding of the above and elects to move forward with surgery at this time. He was seen with Dr. Alfonse Spruce.

## 2015-09-28 ENCOUNTER — Telehealth (INDEPENDENT_AMBULATORY_CARE_PROVIDER_SITE_OTHER): Payer: Self-pay | Admitting: Neurosurgery

## 2015-09-28 NOTE — Telephone Encounter (Signed)
Patient's surgery that was scheduled for 09/27/15 with Dr. Alfonse Spruce was cancelled by PCP due to elevated WBC count. PCP put patient on antibiotics. Called patient and LVM to reschedule surgery. Offered patient 10/12/15 @ 8:30am (check-in 6:30). Left my contact information for callback.

## 2015-10-11 DIAGNOSIS — M2578 Osteophyte, vertebrae: Secondary | ICD-10-CM

## 2015-10-11 DIAGNOSIS — M4722 Other spondylosis with radiculopathy, cervical region: Secondary | ICD-10-CM

## 2015-10-11 DIAGNOSIS — M4712 Other spondylosis with myelopathy, cervical region: Secondary | ICD-10-CM

## 2015-10-11 NOTE — Progress Notes (Deleted)
Neurosurgery History and Physical Exam    Chief Complaint:  Cervical spondy    History of Present Illness:     Shawn Mejia is a 64 year old male with a past history significant for cervical spondylosis, degenerative with chronic neck pain primarily, and some evidence of clinical early myelopathy and upper motor neuron changes. He underwent C6 corpectomy and C4-5 anterior cervical discectomy with fusion from C4 to C7 with Dr. Alfonse Spruce on ***      Past Medical and Surgical History:  Past Medical History   Diagnosis Date    Basal cell carcinoma nos 2009    Hyperlipidemia     Pneumonia     Type II or unspecified type diabetes mellitus without mention of complication, not stated as uncontrolled 1999     Past Surgical History   Procedure Laterality Date    Pb appendectomy  1982    Compound fracture right arm  1968    Carpal tunnel- bilateral  2003    Open inguinal hernia repair, bilateral  2002    Right open inguinal hernia repair  2003    Right open inguinal hernia repair  2009    Right open inguinal hernia repair  2010       Allergies:  No Known Allergies    Medications:  Current Outpatient Prescriptions   Medication Sig    aspirin (ASPIR-LOW) 81 MG EC tablet Take 81 mg by mouth daily.    esomeprazole (NEXIUM) 40 MG capsule Take 1 capsule by mouth 2 times daily.    ezetimibe-simvastatin (VYTORIN) 10-20 MG per tablet Take 1 tablet by mouth every evening.    gabapentin (NEURONTIN) 300 MG capsule Take 3 capsules by mouth 3 times daily.    metformin (GLUCOPHAGE XR) 750 MG XR tablet Take 1,000 mg by mouth daily.    Multiple Vitamins-Minerals (MULTIVITAL-M) TABS     nortriptyline (PAMELOR) 10 MG capsule Take 1 capsule by mouth nightly.    Omega-3 Fatty Acids (FISH OIL) 500 MG capsule Take 1,000 mg by mouth daily. 1-2 tabs daily    pancrealipase (CREON 24000) 24000 UNIT CPEP Take 2 capsules by mouth 3 times daily (with meals). (Patient taking differently: Take 4-5 capsules by mouth 3 times daily (with  meals).)     No current facility-administered medications for this visit.        Social History:  Social History     Social History    Marital status: Married     Spouse name: N/A    Number of children: N/A    Years of education: N/A     Occupational History    Radio producer      Social History Main Topics    Smoking status: Current Every Day Smoker     Packs/day: 1.00     Years: 30.00     Types: Cigarettes    Smokeless tobacco: Never Used    Alcohol use Yes    Drug use: Not on file    Sexual activity: Not on file     Social Activities of Daily Living Present    Not on file     Social History Narrative       Family History:  Family History   Problem Relation Age of Onset    Heart Disease Father         Review of Systems:  Per HPI, All other review of systems were discussed and reported negative    Physical Exam:  There were no vitals  taken for this visit.  ***      Imaging:  ***    Recommendations:  64 year old male with ***    The patient was seen and examined with Dr. Alfonse Spruce.

## 2015-10-12 ENCOUNTER — Encounter (INDEPENDENT_AMBULATORY_CARE_PROVIDER_SITE_OTHER): Payer: TRICARE Prime—HMO | Admitting: Neurosurgery

## 2015-10-16 NOTE — Progress Notes (Signed)
[facility] ATTENDING    Patient seen and examined today by myself with the NP.  Patient's exam and case reviewed.  I agree with the assessment and management plan as stated by the note by Laurie Panda.      I discussed with Elgie the clinical diagnosis and prognosis, the imaging studies, and reviewed the management options for the scheduled surgery.  Majority of time spent with counseling.  Time spent (15) minutes.    Thank you, Dr. Maylon Peppers, Rhyl for this opportunity to participate in the care of this pleasant patient.  Portions of this report were dictated using voice recognition software and even though it has been reviewed, there may be syntax/dictation errors present.  Please contact the [facility] if there is any question, concern or need for clarification.

## 2015-11-04 ENCOUNTER — Ambulatory Visit (INDEPENDENT_AMBULATORY_CARE_PROVIDER_SITE_OTHER): Payer: TRICARE Prime—HMO | Admitting: Neurosurgery

## 2015-11-04 VITALS — BP 140/80 | HR 77 | Temp 98.6°F | Resp 15 | Ht 70.0 in | Wt 190.0 lb

## 2015-11-04 DIAGNOSIS — Z09 Encounter for follow-up examination after completed treatment for conditions other than malignant neoplasm: Secondary | ICD-10-CM

## 2015-11-04 DIAGNOSIS — M5030 Other cervical disc degeneration, unspecified cervical region: Principal | ICD-10-CM

## 2015-11-04 NOTE — Progress Notes (Signed)
NEUROSURGERY POST-OPERATIVE VISIT    Shawn Mejia returns today in postoperative follow-up from a C4-5 anterior discectomy and C6 anterior corpectomy performed on October 11, 2015.     Patient is doing well since his surgery. He has primary complaint of neck and shoulder stiffness primarily between the shoulder blades. He does not take any narcotic pain medications for this. He has been using warm heat pads. His previous fingertip numbness and tingling has completely resolved. No new neurologic deficits, weakness, numbness or tingling. She has been wearing his collar with good fidelity.    BP 140/80  Pulse 77  Temp 98.6 F (37 C) (Oral)  Resp 15  Ht 5\' 10"  (1.778 m)  Wt 86.2 kg (190 lb)  BMI 27.26 kg/m2    NEUROLOGIC EXAMINATION:   Sensorium:    Awake, alert, pleasant, and cooperative.    Cranial nerves: Grossly intact     Sensory: Normal (light touch).    Motor:  Normal bulk, tone.  No rigidity.    Full strength in the bilateral arms and legs    Gait:  Shawn Mejia ambulated with a normal gait.      Incision: Healing well.  No evidence of dehiscence, discharge, or infection.  Skin glue in place without evidence of infection.    Imaging studies reviewed:  Static cervical AP and lateral films were reviewed which showed stable placement of his fusion construct at C4-5 and corpectomy cage at C6.    Assessment and plan:  Cervical stenosis with myelopathy, addressed with multilevel anterior decompression and instrumented fusion. Patient doing well neurologically without any clear neurologic deficits or new symptoms. We will continue with cervical collar immobilization at all times for the next 3 weeks then can discontinue cervical collar precautions at night while in bed. He will continue to use cervical collar when up and out of bed for the remainder of the 3 months. I will see him back at the three-month time point after his surgery with new flexion-extension cervical x-rays. If those remain stable, we will then wean him off of  cervical collar use.    Thank you, Dr. Maylon Peppers, Rhyl for this opportunity to participate in the care of this pleasant patient.

## 2016-01-13 ENCOUNTER — Ambulatory Visit (INDEPENDENT_AMBULATORY_CARE_PROVIDER_SITE_OTHER): Payer: TRICARE Prime—HMO | Admitting: Neurosurgery

## 2016-01-13 VITALS — BP 100/77 | HR 70 | Temp 98.7°F | Resp 16 | Ht 70.0 in | Wt 180.0 lb

## 2016-01-13 DIAGNOSIS — M5030 Other cervical disc degeneration, unspecified cervical region: Principal | ICD-10-CM

## 2016-01-13 NOTE — Progress Notes (Signed)
NEUROSURGERY CLINIC ATTENDING    [name] returns today over 3 months since his anterior cervical discectomy and fusion at C4-5 and cervical corpectomy at C6 with fusion down to C7.    Patient seen and examined with Laurie Panda, nurse practitioner, please see his notes for full details. Overall, patient's is doing well and reports greater than 50% improvement in his symptoms. He has occasional neck soreness which responds to rest. No longer takes pain medications. I did review his flexion-extension cervical x-rays today which show stable positioning and alignment of his fusion construct without any abnormal subluxation on dynamic views. Apparently, he is moving to [facility] in the very near future, however would like to continue following up with me for his care. As such, we will order new flexion-extension cervical x-rays and see him back in 3 months.    Patient seen and examined today by myself with the NP.  Patient's exam and case reviewed.  I agree with the assessment and management plan as stated by the note by Laurie Panda.      I discussed with Shawn Mejia the clinical diagnosis and prognosis, the imaging studies, and reviewed the management options.  Majority of time spent with counseling.  Time spent (15) minutes.    Thank you, Dr. Maylon Peppers, Rhyl for this opportunity to participate in the care of this pleasant patient.  Portions of this report were dictated using voice recognition software and even though it has been reviewed, there may be syntax/dictation errors present.  Please contact the [facility] if there is any question, concern or need for clarification.

## 2016-01-13 NOTE — Progress Notes (Signed)
FOLLOW-UP VISIT: Recheck    Shawn Mejia is a  65 year old year-old male who is following up after C4-5 ACDF with C6 corpectomy 10/11/15 with Dr. Alfonse Spruce.     Neck pain is now mild. ROM is improving. He denies numbness. He is walking better than before surgery.     PREVIOUS SURGICAL HISTORY: Shawn Mejia  has a past surgical history that includes Appendectomy (1982); compound fracture right arm (1968); carpal tunnel- bilateral (2003); open inguinal hernia repair, bilateral (2002); right open inguinal hernia repair (2003); right open inguinal hernia repair (2009); and right open inguinal hernia repair (2010).     CURRENT MEDICATIONS:   aspirin (ASPIR-LOW) 81 MG EC tablet Take 81 mg by mouth daily.   esomeprazole (NEXIUM) 40 MG capsule Take 1 capsule by mouth 2 times daily.   ezetimibe-simvastatin (VYTORIN) 10-20 MG per tablet Take 1 tablet by mouth every evening.   gabapentin (NEURONTIN) 300 MG capsule Take 3 capsules by mouth 3 times daily.   metformin (GLUCOPHAGE XR) 750 MG XR tablet Take 1,000 mg by mouth daily.   Multiple Vitamins-Minerals (MULTIVITAL-M) TABS    nortriptyline (PAMELOR) 10 MG capsule Take 1 capsule by mouth nightly.   Omega-3 Fatty Acids (FISH OIL) 500 MG capsule Take 1,000 mg by mouth daily. 1-2 tabs daily   pancrealipase (CREON 24000) 24000 UNIT CPEP Take 2 capsules by mouth 3 times daily (with meals). (Patient taking differently: Take 4-5 capsules by mouth 3 times daily (with meals).)       ALLERGIES: Shawn Mejia has No Known Allergies.    NEUROLOGIC EXAMINATION:   Sensorium:    Awake, alert, pleasant, and cooperative.    Cranial nerves: II-XII intact    Incision:  Healed   Sensory:  normal  Gait:   Gait is normal.  ROM:   Limited/stiff  Motor:   Normal bulk, tone.  No rigidity.      MOTOR STRENGTH        Upper extremeties: RUE LUE Lower extremities: RLE LLE   Deltoid 5/5 5/5 Iliopsoas 5/5 5/5   Biceps 5/5 5/5 Quadriceps 5/5 5/5   Triceps 5/5 5/5 Biceps Femoris 5/5 5/5   Wrist extension 5/5 5/5      Hand grip  5/5 5/5 Tibialis anterior 5/5 5/5   PIP extension 5/5 5/5 Gastrocnemius 5/5 5/5   Intrinsics 5/5 5/5 EHL 5/5 5/5     DEEP TENDON REFLEXES        Upper extremities: RUE LUE Lower extremities: RLE LLE   Biceps 2+ 2+ Patellar 2+ 2+   Triceps 2+ 2+ Ankle 2+ 2+   Brachioradialis 2+ 2+          RADIOGRAPHIC REVIEW:   Cervical xrays 01/03/16 show stable instrumentation without loosening/failure. No flex/ext changes.    ASSESSMENT/PLAN: Shawn Mejia returns today as a follow-up after cervical fusion. He is doing much better after surgery. He will be moving to [place], [place] in 2 weeks.  PLAN:  PT in [place] once he settles there  F/u here over the summer with new xrays to eval progress of fusion.    He is encouraged to call with questions/concerns.

## 2016-04-19 ENCOUNTER — Telehealth (INDEPENDENT_AMBULATORY_CARE_PROVIDER_SITE_OTHER): Payer: Self-pay | Admitting: Neurosurgery

## 2016-04-19 NOTE — Telephone Encounter (Signed)
Called patient and LVM informing patient that appointment with Dr. Alfonse Spruce on 6/29 is going to be cancelled because NP is out sick, and Dr. Alfonse Spruce is out of the country. Left my callback number to reschedule appointment.

## 2016-04-20 ENCOUNTER — Encounter (INDEPENDENT_AMBULATORY_CARE_PROVIDER_SITE_OTHER): Payer: Medicare Other | Admitting: Neurosurgery

## 2018-11-25 ENCOUNTER — Ambulatory Visit (INDEPENDENT_AMBULATORY_CARE_PROVIDER_SITE_OTHER): Payer: Medicare Other | Admitting: Nurse Practitioner

## 2018-11-25 ENCOUNTER — Encounter: Payer: Self-pay | Admitting: Nurse Practitioner

## 2018-11-25 VITALS — BP 98/53 | HR 78 | Temp 98.4°F | Resp 18 | Ht 68.5 in | Wt 159.4 lb

## 2018-11-25 DIAGNOSIS — J410 Simple chronic bronchitis: Secondary | ICD-10-CM

## 2018-11-25 DIAGNOSIS — Z7689 Persons encountering health services in other specified circumstances: Secondary | ICD-10-CM | POA: Diagnosis not present

## 2018-11-25 DIAGNOSIS — K219 Gastro-esophageal reflux disease without esophagitis: Secondary | ICD-10-CM

## 2018-11-25 DIAGNOSIS — E1169 Type 2 diabetes mellitus with other specified complication: Secondary | ICD-10-CM | POA: Diagnosis not present

## 2018-11-25 DIAGNOSIS — E785 Hyperlipidemia, unspecified: Secondary | ICD-10-CM

## 2018-11-25 DIAGNOSIS — E1165 Type 2 diabetes mellitus with hyperglycemia: Secondary | ICD-10-CM | POA: Diagnosis not present

## 2018-11-25 DIAGNOSIS — L84 Corns and callosities: Secondary | ICD-10-CM

## 2018-11-25 NOTE — Patient Instructions (Addendum)
Raymond Huff,   Thank you for coming in to clinic today.  1. You will be due for FASTING BLOOD WORK.  This means you should eat no food or drink after midnight.  Drink only water or coffee without cream/sugar on the morning of your lab visit. - Please go ahead and schedule a "Lab Only" visit in the morning at the clinic for lab draw in the next 7 days. - Your results will be available about 2-3 days after blood draw.  If you have set up a MyChart account, you can can log in to MyChart online to view your results and a brief explanation. Also, we can discuss your results together at your next office visit if you would like.  Please schedule a follow-up appointment with Wilhelmina Mcardle, AGNP. Return in about 3 months (around 02/23/2019) for diabetes.  If you have any other questions or concerns, please feel free to call the clinic or send a message through MyChart. You may also schedule an earlier appointment if necessary.  You will receive a survey after today's visit either digitally by e-mail or paper by [employer]. Your experiences and feedback matter to [place].  Please respond so we know how we are doing as we provide care for you.   Wilhelmina Mcardle, DNP, AGNP-BC Adult Gerontology Nurse Practitioner [facility] [place]

## 2018-11-25 NOTE — Progress Notes (Signed)
Subjective:    Patient ID: Raymond Huff, male    DOB: [birth year], 68 y.o.   MRN: [id number]  Raymond SpeedJohn Heindl is a 68 y.o. male presenting on 11/25/2018 for Establish Care and Diabetes  HPI Establish Care New Provider Pt last seen by PCP in [place] about 4 months ago.  Obtain records.   Patient has recently moved here from [place]. January would have been 90-day recheck.  DMT2: metformin ER 500 mg twice daily,   Januvia 50 mg once daily, Lantus 35 units twice daily.   A1c 12-14 at initial diagnosis.  Last was around 7.5%.  Patient states he is taking his medication and tolerating well without side effects currently. - He is not currently symptomatic.  - He denies polydipsia, polyphagia, polyuria, headaches, diaphoresis, shakiness, chills, pain, numbness or tingling in extremities and changes in vision.   - Patient with history of Whipple in past, taking pancreatic enzymes - Creon: takes 5-7 tabs with each meal.  Patient notes no DM prior to Whipple.  Recent Labs    11/26/18 1015  HGBA1C 9.7*    Hypertension: metoprolol 25 mg once daily, Had "a little hiccup in one of the chambers," possibly due to diabetes.  Chronic low back pain: patient takes flexeril 5 mg lower back pain and takes only as needed for over-exertion.  Takes less than 2-3 times per month.  URI symptoms, cough Has had cold with lingering symptoms with cough for 2 months.  May have smoker's cough, but would like to have "everything looked at".  He denies any fever, chills, or sweats, sinus pressure, ear pain/pressure, shortness of breath.  He has had productive cough and wheezing.  Finger lesion Thickened skin tip of 5th finger Right hand - Patient reports finger laceration with splitting regularly over last 2 years.  At times appears as a callus with splittting.  Patient regularly uses file to keep thickened skin controlled and to prevent splitting.   Past Medical History:  Diagnosis Date  . Diabetes mellitus without  complication (HCC)   . Hyperlipidemia    Past Surgical History:  Procedure Laterality Date  . APPENDECTOMY    . CARPAL TUNNEL RELEASE    . HERNIA REPAIR     abdominal  . INGUINAL HERNIA REPAIR     Pt reports x 7 in past  . PANCREATICODUODENECTOMY     whipple  . ROTATOR CUFF REPAIR Bilateral   . Vertebrae fusion     4,5,6, and 7    Social History   Socioeconomic History  . Marital status: Married    Spouse name: Not on file  . Number of children: Not on file  . Years of education: Not on file  . Highest education level: High school graduate  Occupational History  . Not on file  Social Needs  . Financial resource strain: Not on file  . Food insecurity:    Worry: Not on file    Inability: Not on file  . Transportation needs:    Medical: Not on file    Non-medical: Not on file  Tobacco Use  . Smoking status: Current Every Day Smoker    Packs/day: 1.00    Years: 48.00    Pack years: 48.00    Types: Cigarettes  . Smokeless tobacco: Never Used  . Tobacco comment: has quit x 2 for 3.5 years each  Substance and Sexual Activity  . Alcohol use: Yes  . Drug use: Never  . Sexual activity: Not Currently  Lifestyle  .  Physical activity:    Days per week: Not on file    Minutes per session: Not on file  . Stress: Not on file  Relationships  . Social connections:    Talks on phone: Not on file    Gets together: Not on file    Attends religious service: Not on file    Active member of club or organization: Not on file    Attends meetings of clubs or organizations: Not on file    Relationship status: Not on file  . Intimate partner violence:    Fear of current or ex partner: Not on file    Emotionally abused: Not on file    Physically abused: Not on file    Forced sexual activity: Not on file  Other Topics Concern  . Not on file  Social History Narrative  . Not on file   Family History  Problem Relation Age of Onset  . Heart disease Father    Current Outpatient  Medications on File Prior to Visit  Medication Sig  . aspirin EC 81 MG tablet Take by mouth.  . cyclobenzaprine (FLEXERIL) 5 MG tablet Take 5 mg by mouth 3 (three) times daily as needed for muscle spasms.  Marland Kitchen esomeprazole (NEXIUM) 40 MG capsule   . insulin glargine (LANTUS) 100 UNIT/ML injection Inject 35 Units into the skin 2 (two) times daily.  Marland Kitchen JANUVIA 50 MG tablet   . lipase/protease/amylase (CREON) 12000 units CPEP capsule Take by mouth.  . metFORMIN (GLUCOPHAGE-XR) 500 MG 24 hr tablet   . metoprolol tartrate (LOPRESSOR) 25 MG tablet   . Multiple Vitamin (MULTIVITAMIN) tablet Take 1 tablet by mouth daily.  . Omega-3 Fatty Acids (FISH OIL) 500 MG CAPS Take by mouth.  . simvastatin (ZOCOR) 40 MG tablet Take 40 mg by mouth daily.   No current facility-administered medications on file prior to visit.     Review of Systems  Constitutional: Negative for activity change, appetite change, fatigue and unexpected weight change.  HENT: Negative for congestion, hearing loss and trouble swallowing.   Eyes: Negative for visual disturbance.  Respiratory: Negative for choking, shortness of breath and wheezing.   Cardiovascular: Negative for chest pain and palpitations.  Gastrointestinal: Negative for abdominal pain, blood in stool, constipation and diarrhea.  Genitourinary: Negative for difficulty urinating and flank pain.  Musculoskeletal: Negative for arthralgias, back pain and myalgias.  Skin: Positive for wound. Negative for color change and rash.  Allergic/Immunologic: Negative for environmental allergies.  Neurological: Negative for dizziness, seizures, weakness and headaches.  Psychiatric/Behavioral: Negative for behavioral problems, decreased concentration, dysphoric mood, sleep disturbance and suicidal ideas. The patient is not nervous/anxious.    Per HPI unless specifically indicated above     Objective:    BP (!) 98/53 (BP Location: Right Arm, Patient Position: Sitting, Cuff Size:  Normal)   Pulse 78   Temp 98.4 F (36.9 C) (Oral)   Resp 18   Ht 5' 8.5" (1.74 m)   Wt 159 lb 6.4 oz (72.3 kg)   SpO2 95%   BMI 23.88 kg/m   Wt Readings from Last 3 Encounters:  11/25/18 159 lb 6.4 oz (72.3 kg)    Physical Exam Vitals signs reviewed.  Constitutional:      General: He is awake. He is not in acute distress.    Appearance: He is well-developed.  HENT:     Head: Normocephalic and atraumatic.     Nose: Nose normal.     Mouth/Throat:  Mouth: Mucous membranes are moist.     Pharynx: Oropharynx is clear.  Eyes:     Conjunctiva/sclera: Conjunctivae normal.     Pupils: Pupils are equal, round, and reactive to light.  Neck:     Musculoskeletal: Normal range of motion and neck supple.     Vascular: No carotid bruit.  Cardiovascular:     Rate and Rhythm: Normal rate and regular rhythm.     Pulses:          Radial pulses are 2+ on the right side and 2+ on the left side.       Posterior tibial pulses are 1+ on the right side and 1+ on the left side.     Heart sounds: Normal heart sounds, S1 normal and S2 normal.  Pulmonary:     Effort: Pulmonary effort is normal. No respiratory distress.     Breath sounds: Normal air entry. No wheezing, rhonchi or rales.  Skin:    General: Skin is warm and dry.     Capillary Refill: Capillary refill takes less than 2 seconds.  Neurological:     General: No focal deficit present.     Mental Status: He is alert and oriented to person, place, and time.  Psychiatric:        Attention and Perception: Attention normal.        Mood and Affect: Mood and affect normal.        Behavior: Behavior normal. Behavior is cooperative.        Thought Content: Thought content normal.        Judgment: Judgment normal.    Results for orders placed or performed in visit on 11/25/18  Lipid panel  Result Value Ref Range   Cholesterol 150 <200 mg/dL   HDL 44 > OR = 40 mg/dL   Triglycerides 161 <096 mg/dL   LDL Cholesterol (Calc) 84 mg/dL (calc)    Total CHOL/HDL Ratio 3.4 <5.0 (calc)   Non-HDL Cholesterol (Calc) 106 <130 mg/dL (calc)  Hemoglobin E4V  Result Value Ref Range   Hgb A1c MFr Bld 9.7 (H) <5.7 % of total Hgb   Mean Plasma Glucose 232 (calc)   eAG (mmol/L) 12.8 (calc)  COMPLETE METABOLIC PANEL WITH GFR  Result Value Ref Range   Glucose, Bld 164 (H) 65 - 99 mg/dL   BUN 12 7 - 25 mg/dL   Creat 4.09 8.11 - 9.14 mg/dL   GFR, Est Non African American 92 > OR = 60 mL/min/1.6m2   GFR, Est African American 106 > OR = 60 mL/min/1.90m2   BUN/Creatinine Ratio NOT APPLICABLE 6 - 22 (calc)   Sodium 139 135 - 146 mmol/L   Potassium 4.9 3.5 - 5.3 mmol/L   Chloride 105 98 - 110 mmol/L   CO2 25 20 - 32 mmol/L   Calcium 8.9 8.6 - 10.3 mg/dL   Total Protein 6.2 6.1 - 8.1 g/dL   Albumin 3.7 3.6 - 5.1 g/dL   Globulin 2.5 1.9 - 3.7 g/dL (calc)   AG Ratio 1.5 1.0 - 2.5 (calc)   Total Bilirubin 0.5 0.2 - 1.2 mg/dL   Alkaline phosphatase (APISO) 99 35 - 144 U/L   AST 16 10 - 35 U/L   ALT 18 9 - 46 U/L  CBC with Differential/Platelet  Result Value Ref Range   WBC 9.6 3.8 - 10.8 Thousand/uL   RBC 5.10 4.20 - 5.80 Million/uL   Hemoglobin 16.1 13.2 - 17.1 g/dL   HCT 78.2 95.6 - 21.3 %  MCV 93.7 80.0 - 100.0 fL   MCH 31.6 27.0 - 33.0 pg   MCHC 33.7 32.0 - 36.0 g/dL   RDW 16.112.4 09.611.0 - 04.515.0 %   Platelets 322 140 - 400 Thousand/uL   MPV 9.7 7.5 - 12.5 fL   Neutro Abs 5,347 1,500 - 7,800 cells/uL   Lymphs Abs 3,062 850 - 3,900 cells/uL   Absolute Monocytes 768 200 - 950 cells/uL   Eosinophils Absolute 288 15 - 500 cells/uL   Basophils Absolute 134 0 - 200 cells/uL   Neutrophils Relative % 55.7 %   Total Lymphocyte 31.9 %   Monocytes Relative 8.0 %   Eosinophils Relative 3.0 %   Basophils Relative 1.4 %      Assessment & Plan:   Problem List Items Addressed This Visit      Respiratory   Simple chronic bronchitis (HCC) Patient with normal exam today.  Occasional cough during visit, but with normal lung sounds.  No treatment  modification needed at this time.  Instructed patient to call clinic if worsening symptoms.  FOLLOW-UP prn.     Digestive   Gastroesophageal reflux disease Currently well controlled on esomeprazole 40 mg once daily.  No active signs and symptoms of bleeding.  Plan: 1. Continue esomeprazole 40 mg once daily. Side effects discussed. Pt wants to continue med. 2. Avoid diet triggers. Reviewed need to seek care if globus sensation, difficulty swallowing, s/sx of GI bleed. 3. Follow up as needed and in 3 months.    Relevant Medications   esomeprazole (NEXIUM) 40 MG capsule   lipase/protease/amylase (CREON) 12000 units CPEP capsule   Other Relevant Orders   COMPLETE METABOLIC PANEL WITH GFR (Completed)   CBC with Differential/Platelet (Completed)     Endocrine   Uncontrolled type 2 diabetes mellitus with hyperglycemia (HCC) - Primary Uncontrolled DM with A1c > 9% and prior exact value unknown (patient reported between 7-8%).  Goal A1c < 7.0%. - Complications - hyperlipidemia and hyperglycemia.  Plan:  1. Change therapy:  - Increase sitagliptin and metformin - Continue Lantus without changes - Consider increase of lantus or addition of post-prandial focused med in future (GLP-1 inhibitor or bolus insulin) 2. Encourage improved lifestyle: - low carb/low glycemic diet encouraged - Increase physical activity to 30 minutes most days of the week.  Explained that increased physical activity increases body's use of sugar for energy. 3. Check fasting am CBG and bring log to next visit for review 4. Continue ASA and Statin 5. Follow-up 3 months    Relevant Medications   aspirin EC 81 MG tablet   simvastatin (ZOCOR) 40 MG tablet   insulin glargine (LANTUS) 100 UNIT/ML injection   sitaGLIPtin (JANUVIA) 100 MG tablet   metFORMIN (GLUCOPHAGE-XR) 500 MG 24 hr tablet   Other Relevant Orders   Lipid panel (Completed)   Hemoglobin A1c (Completed)   COMPLETE METABOLIC PANEL WITH GFR (Completed)    Dyslipidemia associated with type 2 diabetes mellitus (HCC) Patient with elevated lipids in past on simvastatin daily.  Tolerating well currently without side effects or any ASCVD events.  Follow-up 3 months.   Relevant Medications   aspirin EC 81 MG tablet   simvastatin (ZOCOR) 40 MG tablet   insulin glargine (LANTUS) 100 UNIT/ML injection   sitaGLIPtin (JANUVIA) 100 MG tablet   metFORMIN (GLUCOPHAGE-XR) 500 MG 24 hr tablet   Other Relevant Orders   Lipid panel (Completed)   Hemoglobin A1c (Completed)   COMPLETE METABOLIC PANEL WITH GFR (Completed)  Other Visit Diagnoses    Encounter to establish care     Previous PCP was in [place].  Records will be requested.  Past medical, family, and surgical history reviewed w/ patient in clinic today.     Skin callus     Poorly healing skin wound with chronic reopening.  Concern for possible skin malignancy given poor healing pattern.  Likely is callus with difficulty healing due to location. - Referral dermatology recommended to eval for possible malignancy. - Follow-up prn.   Relevant Orders   Ambulatory referral to Dermatology      Meds ordered this encounter  Medications  . sitaGLIPtin (JANUVIA) 100 MG tablet    Sig: Take 1 tablet (100 mg total) by mouth daily.    Dispense:  90 tablet    Refill:  1    Fill when patient requests refill    Order Specific Question:   Supervising Provider    Answer:   Smitty Cords [[id number]]  . metFORMIN (GLUCOPHAGE-XR) 500 MG 24 hr tablet    Sig: Take 2 tablets (1,000 mg total) by mouth daily with breakfast AND 1 tablet (500 mg total) daily with supper. In 14 days, take 1000 mg bid..    Dispense:  360 tablet    Refill:  1    Fill when patient requests refill.    Order Specific Question:   Supervising Provider    Answer:   Smitty Cords [[id number]]     Follow up plan: Return in about 3 months (around 02/23/2019) for diabetes.  Wilhelmina Mcardle, DNP, AGPCNP-BC Adult Gerontology Primary  Care Nurse Practitioner [facility] 11/25/2018, 10:55 AM

## 2018-11-27 ENCOUNTER — Encounter: Payer: Self-pay | Admitting: Nurse Practitioner

## 2018-11-27 DIAGNOSIS — E785 Hyperlipidemia, unspecified: Secondary | ICD-10-CM

## 2018-11-27 DIAGNOSIS — K219 Gastro-esophageal reflux disease without esophagitis: Secondary | ICD-10-CM | POA: Insufficient documentation

## 2018-11-27 DIAGNOSIS — J410 Simple chronic bronchitis: Secondary | ICD-10-CM | POA: Insufficient documentation

## 2018-11-27 DIAGNOSIS — E1169 Type 2 diabetes mellitus with other specified complication: Secondary | ICD-10-CM | POA: Insufficient documentation

## 2018-11-27 DIAGNOSIS — E1165 Type 2 diabetes mellitus with hyperglycemia: Secondary | ICD-10-CM | POA: Insufficient documentation

## 2018-11-27 LAB — COMPLETE METABOLIC PANEL WITH GFR
AG Ratio: 1.5 (calc) (ref 1.0–2.5)
ALT: 18 U/L (ref 9–46)
AST: 16 U/L (ref 10–35)
Albumin: 3.7 g/dL (ref 3.6–5.1)
Alkaline phosphatase (APISO): 99 U/L (ref 35–144)
BUN: 12 mg/dL (ref 7–25)
CO2: 25 mmol/L (ref 20–32)
Calcium: 8.9 mg/dL (ref 8.6–10.3)
Chloride: 105 mmol/L (ref 98–110)
Creat: 0.82 mg/dL (ref 0.70–1.25)
GFR, Est African American: 106 mL/min/{1.73_m2} (ref >=60)
GFR, Est Non African American: 92 mL/min/{1.73_m2} (ref >=60)
Globulin: 2.5 g/dL (calc) (ref 1.9–3.7)
Glucose, Bld: 164 mg/dL — ABNORMAL HIGH (ref 65–99)
Potassium: 4.9 mmol/L (ref 3.5–5.3)
Sodium: 139 mmol/L (ref 135–146)
Total Bilirubin: 0.5 mg/dL (ref 0.2–1.2)
Total Protein: 6.2 g/dL (ref 6.1–8.1)

## 2018-11-27 LAB — CBC WITH DIFFERENTIAL/PLATELET
Absolute Monocytes: 768 cells/uL (ref 200–950)
Basophils Absolute: 134 cells/uL (ref 0–200)
Basophils Relative: 1.4 %
Eosinophils Absolute: 288 cells/uL (ref 15–500)
Eosinophils Relative: 3 %
HCT: 47.8 % (ref 38.5–50.0)
Hemoglobin: 16.1 g/dL (ref 13.2–17.1)
Lymphs Abs: 3062 cells/uL (ref 850–3900)
MCH: 31.6 pg (ref 27.0–33.0)
MCHC: 33.7 g/dL (ref 32.0–36.0)
MCV: 93.7 fL (ref 80.0–100.0)
MPV: 9.7 fL (ref 7.5–12.5)
Monocytes Relative: 8 %
Neutro Abs: 5347 cells/uL (ref 1500–7800)
Neutrophils Relative %: 55.7 %
Platelets: 322 10*3/uL (ref 140–400)
RBC: 5.1 10*6/uL (ref 4.20–5.80)
RDW: 12.4 % (ref 11.0–15.0)
Total Lymphocyte: 31.9 %
WBC: 9.6 10*3/uL (ref 3.8–10.8)

## 2018-11-27 LAB — LIPID PANEL
Cholesterol: 150 mg/dL (ref <200)
HDL: 44 mg/dL (ref >=40)
LDL Cholesterol (Calc): 84 mg/dL (calc)
Non-HDL Cholesterol (Calc): 106 mg/dL (calc) (ref <130)
Total CHOL/HDL Ratio: 3.4 (calc) (ref <5.0)
Triglycerides: 119 mg/dL (ref <150)

## 2018-11-27 LAB — HEMOGLOBIN A1C
Hgb A1c MFr Bld: 9.7 % of total Hgb — ABNORMAL HIGH (ref <5.7)
Mean Plasma Glucose: 232 (calc)
eAG (mmol/L): 12.8 (calc)

## 2018-11-27 MED ORDER — METFORMIN HCL ER 500 MG PO TB24: ORAL_TABLET | ORAL | 1 refills | Status: DC

## 2018-11-27 MED ORDER — SITAGLIPTIN PHOSPHATE 100 MG PO TABS: 100.0000 mg | ORAL_TABLET | Freq: Every day | ORAL | 1 refills | Status: DC

## 2018-11-28 ENCOUNTER — Other Ambulatory Visit: Payer: Self-pay

## 2018-11-28 ENCOUNTER — Telehealth: Payer: Self-pay

## 2018-11-28 NOTE — Telephone Encounter (Signed)
The pt stop by the office to clarify his medications . He is currently taking Creon 24 24,000units three times daily.   He also is currently takes Metformin 500 MG XR 1 tablet twice daily.     The pt was notified of his lab results, no questions or concerns. He will call back with the correct dosage of his metformin and creon when he gets to the house. ------  Notes recorded by Lonna Cobb, CMA on 11/27/2018 at 11:07 AM EST I attempted to contact the pt. The phone message states that circuits are busy. I attempted to contact the pt several times. I will try again later. ------  Notes recorded by Galen Manila, NP on 11/27/2018 at 8:53 AM EST Lipid panel: normal No need to change medications.  CBC is normal. You do not have anemia.   CMP shows elevated glucose. Normal electrolytes, kidney function, liver function.  Hemoglobin A1c shows high A1c at 9.7%. We will need to change medications to get A1c to goal < 7.0% - Increase Januvia to 100 mg once daily. New Rx sent. He may take two tabs of current sitagliptin 50 mg supply until needing refill. - Verify metformin dose prior to making changes. I see he is taking 2 tabs daily. Needs to take as close to 2,000 mg daily as he can tolerate. START by taking metformin XR 1,000 mg at breakfast and 500 mg at supper. After 2 weeks, increase to 1,000 mg bid with meals. If GI upset/diarrhea, call clinic for advice.

## 2018-12-19 DIAGNOSIS — M542 Cervicalgia: Secondary | ICD-10-CM | POA: Insufficient documentation

## 2018-12-22 DIAGNOSIS — G8929 Other chronic pain: Secondary | ICD-10-CM | POA: Insufficient documentation

## 2018-12-22 DIAGNOSIS — M545 Low back pain, unspecified: Secondary | ICD-10-CM | POA: Insufficient documentation

## 2018-12-31 ENCOUNTER — Other Ambulatory Visit: Payer: Self-pay

## 2018-12-31 MED ORDER — METOPROLOL TARTRATE 25 MG PO TABS: 25.0000 mg | ORAL_TABLET | Freq: Every day | ORAL | 1 refills | Status: DC

## 2019-01-06 ENCOUNTER — Other Ambulatory Visit: Payer: Self-pay

## 2019-01-06 MED ORDER — METOPROLOL TARTRATE 25 MG PO TABS: 25.0000 mg | ORAL_TABLET | Freq: Every day | ORAL | 1 refills | Status: DC

## 2019-01-24 ENCOUNTER — Other Ambulatory Visit: Payer: Self-pay

## 2019-01-24 DIAGNOSIS — E1165 Type 2 diabetes mellitus with hyperglycemia: Secondary | ICD-10-CM

## 2019-01-24 MED ORDER — SIMVASTATIN 40 MG PO TABS: 40.0000 mg | ORAL_TABLET | Freq: Every day | ORAL | 0 refills | Status: DC

## 2019-01-24 MED ORDER — SITAGLIPTIN PHOSPHATE 100 MG PO TABS: 100.0000 mg | ORAL_TABLET | Freq: Every day | ORAL | 0 refills | Status: DC

## 2019-02-07 ENCOUNTER — Other Ambulatory Visit: Payer: Self-pay

## 2019-02-07 DIAGNOSIS — E1165 Type 2 diabetes mellitus with hyperglycemia: Secondary | ICD-10-CM

## 2019-02-07 MED ORDER — SITAGLIPTIN PHOSPHATE 100 MG PO TABS: 100.0000 mg | ORAL_TABLET | Freq: Every day | ORAL | 0 refills | Status: DC

## 2019-02-11 ENCOUNTER — Other Ambulatory Visit: Payer: Self-pay | Admitting: Nurse Practitioner

## 2019-02-11 ENCOUNTER — Telehealth: Payer: Self-pay

## 2019-02-11 DIAGNOSIS — E1165 Type 2 diabetes mellitus with hyperglycemia: Secondary | ICD-10-CM

## 2019-02-11 MED ORDER — METFORMIN HCL ER 500 MG PO TB24: 1000.0000 mg | ORAL_TABLET | Freq: Two times a day (BID) | ORAL | 1 refills | Status: DC

## 2019-02-11 NOTE — Telephone Encounter (Signed)
Prescription sent over to pt pharmacy.

## 2019-02-19 ENCOUNTER — Other Ambulatory Visit: Payer: Self-pay

## 2019-02-19 DIAGNOSIS — E1165 Type 2 diabetes mellitus with hyperglycemia: Secondary | ICD-10-CM

## 2019-02-24 ENCOUNTER — Other Ambulatory Visit: Payer: Self-pay

## 2019-02-26 LAB — HEMOGLOBIN A1C
Hgb A1c MFr Bld: 8.6 % of total Hgb — ABNORMAL HIGH (ref <5.7)
Mean Plasma Glucose: 200 (calc)
eAG (mmol/L): 11.1 (calc)

## 2019-02-27 ENCOUNTER — Ambulatory Visit: Payer: Medicare Other | Admitting: Nurse Practitioner

## 2019-02-27 ENCOUNTER — Other Ambulatory Visit: Payer: Self-pay

## 2019-02-27 ENCOUNTER — Ambulatory Visit (INDEPENDENT_AMBULATORY_CARE_PROVIDER_SITE_OTHER): Payer: Medicare Other | Admitting: Family Medicine

## 2019-02-27 ENCOUNTER — Encounter: Payer: Self-pay | Admitting: Family Medicine

## 2019-02-27 DIAGNOSIS — E1165 Type 2 diabetes mellitus with hyperglycemia: Secondary | ICD-10-CM | POA: Diagnosis not present

## 2019-02-27 MED ORDER — SITAGLIPTIN PHOSPHATE 100 MG PO TABS: 100.0000 mg | ORAL_TABLET | Freq: Every day | ORAL | 1 refills | Status: DC

## 2019-02-27 NOTE — Assessment & Plan Note (Signed)
Improving DM control to A1c 8.6, from 9.7 - on increased medication Complication with some hyperglycemia, GERD No hypoglycemia  Plan:  1. Continue current therapy - Januvia 100mg  daily (re ordered), Metformin XR 1000mg  BID (500mg  tabs), Lantus insulin 35u BID 2. Encourage improved lifestyle - low carb, low sugar diet, reduce portion size, continue improving regular exercise - resume bike riding 3. Check CBG, bring log to next visit for review 4. Follow-up 3 months A1c

## 2019-02-27 NOTE — Progress Notes (Signed)
Subjective:    Patient ID: Raymond Huff, male    DOB: 27-Nov-1950, 68 y.o.   MRN: [id number]  Raymond Huff is a 68 y.o. male presenting on 02/27/2019 for Diabetes  Virtual / Telehealth Encounter - Video Visit via [email or web address] The purpose of this virtual visit is to provide medical care while limiting exposure to the novel coronavirus (COVID19) for both patient and office staff.  Consent was obtained for remote visit:  Yes.   Answered questions that patient had about telehealth interaction:  Yes.   I discussed the limitations, risks, security and privacy concerns of performing an evaluation and management service by video/telephone. I also discussed with the patient that there may be a patient responsible charge related to this service. The patient expressed understanding and agreed to proceed.  Patient Location: Home Provider Location: [facility] (Office)  PCP is Wilhelmina McardleLauren Kennedy, AGPCNP-BC - I am currently covering during her maternity leave.   HPI   CHRONIC DM, Type 2: - Last visit with PCP in 11/2018, for same problem Diabetes, treated with increased Januvia from 50 to 100 and Metformin as well, see prior notes for background information. - Interval update with difficulty obtaining metformin new rx, [facility] getting rx for about 1 month, dose was changed on Metformin from 500 BID, now up to 1000mg  - Last lab A1c 8.6 (02/2019) prior 9.7 - Today patient reports he is doing well currently, he believes his sugar would have been even better if he had the medicine the whole 3 months since last visit Meds: Metformin XR 500mg  x 2 BID = 1000mg  BID, Januvia 100mg  daily, Lantus 35u BID Reports  good compliance. Tolerating well w/o side-effects Currently not on ACEi ARB Lifestyle: - Diet (stable unchanged, tries some low carb diet)  - Exercise (recently got his bike fixed and he is able to exercise more now in near future) Denies hypoglycemia, polyuria, visual changes, numbness or  tingling.    Depression screen PHQ 2/9 02/27/2019  Decreased Interest 0  Down, Depressed, Hopeless 0  PHQ - 2 Score 0    Social History   Tobacco Use  . Smoking status: Former Smoker    Packs/day: 1.00    Years: 48.00    Pack years: 48.00    Types: Cigarettes    Last attempt to quit: 02/21/2019    Years since quitting: 0.0  . Smokeless tobacco: Former NeurosurgeonUser  . Tobacco comment: has quit x 2 for 3.5 years each  Substance Use Topics  . Alcohol use: Yes  . Drug use: Never    Review of Systems Per HPI unless specifically indicated above     Objective:    There were no vitals taken for this visit.  Wt Readings from Last 3 Encounters:  11/25/18 159 lb 6.4 oz (72.3 kg)    Physical Exam   Note examination was completely remotely via video observation objective data only  Gen - well-appearing, no acute distress or apparent pain, comfortable HEENT - eyes appear clear without discharge or redness Heart/Lungs - cannot examine virtually - observed no evidence of coughing or labored breathing. Skin - face visible today- no rash Neuro - awake, alert, oriented Psych - not anxious appearing   Recent Labs    11/26/18 1015 02/25/19 0854  HGBA1C 9.7* 8.6*    Results for orders placed or performed in visit on 02/24/19  Hemoglobin A1c  Result Value Ref Range   Hgb A1c MFr Bld 8.6 (H) <5.7 % of total  Hgb   Mean Plasma Glucose 200 (calc)   eAG (mmol/L) 11.1 (calc)      Assessment & Plan:   Problem List Items Addressed This Visit    Uncontrolled type 2 diabetes mellitus with hyperglycemia (HCC) - Primary    Improving DM control to A1c 8.6, from 9.7 - on increased medication Complication with some hyperglycemia, GERD No hypoglycemia  Plan:  1. Continue current therapy - Januvia 100mg  daily (re ordered), Metformin XR 1000mg  BID (500mg  tabs), Lantus insulin 35u BID 2. Encourage improved lifestyle - low carb, low sugar diet, reduce portion size, continue improving regular  exercise - resume bike riding 3. Check CBG, bring log to next visit for review 4. Follow-up 3 months A1c       Relevant Medications   sitaGLIPtin (JANUVIA) 100 MG tablet      Meds ordered this encounter  Medications  . sitaGLIPtin (JANUVIA) 100 MG tablet    Sig: Take 1 tablet (100 mg total) by mouth daily.    Dispense:  90 tablet    Refill:  1    Fill when patient requests refill    Follow-up: - Return in 3 months for DM A1c w/ PCP  Patient verbalizes understanding with the above medical recommendations including the limitation of remote medical advice.  Specific follow-up and call-back criteria were given for patient to follow-up or seek medical care more urgently if needed.  Total duration of direct patient care provided via video conference: 15 minutes   Saralyn Pilar, DO [facility] 02/27/2019, 11:39 AM

## 2019-02-27 NOTE — Patient Instructions (Addendum)
AVS given by phone. No Mychart access. 

## 2019-03-11 DIAGNOSIS — M5126 Other intervertebral disc displacement, lumbar region: Secondary | ICD-10-CM | POA: Insufficient documentation

## 2019-03-20 ENCOUNTER — Other Ambulatory Visit: Payer: Self-pay

## 2019-03-20 MED ORDER — ESOMEPRAZOLE MAGNESIUM 40 MG PO CPDR: 40.0000 mg | DELAYED_RELEASE_CAPSULE | Freq: Every day | ORAL | 1 refills | Status: DC

## 2019-03-20 MED ORDER — GLUCOSE BLOOD VI STRP: ORAL_STRIP | 1 refills | Status: DC

## 2019-03-28 ENCOUNTER — Other Ambulatory Visit: Payer: Self-pay

## 2019-04-24 ENCOUNTER — Other Ambulatory Visit: Payer: Self-pay | Admitting: Nurse Practitioner

## 2019-04-24 DIAGNOSIS — E1165 Type 2 diabetes mellitus with hyperglycemia: Secondary | ICD-10-CM

## 2019-06-04 ENCOUNTER — Other Ambulatory Visit: Payer: Self-pay | Admitting: Nurse Practitioner

## 2019-06-04 ENCOUNTER — Ambulatory Visit: Payer: Medicare Other | Admitting: Nurse Practitioner

## 2019-06-04 DIAGNOSIS — E1165 Type 2 diabetes mellitus with hyperglycemia: Secondary | ICD-10-CM

## 2019-06-04 DIAGNOSIS — E1169 Type 2 diabetes mellitus with other specified complication: Secondary | ICD-10-CM

## 2019-06-04 DIAGNOSIS — E785 Hyperlipidemia, unspecified: Secondary | ICD-10-CM

## 2019-06-05 ENCOUNTER — Encounter: Payer: Self-pay | Admitting: Nurse Practitioner

## 2019-06-05 ENCOUNTER — Other Ambulatory Visit: Payer: Self-pay

## 2019-06-05 ENCOUNTER — Ambulatory Visit (INDEPENDENT_AMBULATORY_CARE_PROVIDER_SITE_OTHER): Payer: Medicare Other | Admitting: Nurse Practitioner

## 2019-06-05 DIAGNOSIS — E1165 Type 2 diabetes mellitus with hyperglycemia: Secondary | ICD-10-CM

## 2019-06-05 LAB — COMPLETE METABOLIC PANEL WITH GFR
AG Ratio: 1.3 (calc) (ref 1.0–2.5)
ALT: 13 U/L (ref 9–46)
AST: 15 U/L (ref 10–35)
Albumin: 4 g/dL (ref 3.6–5.1)
Alkaline phosphatase (APISO): 114 U/L (ref 35–144)
BUN: 15 mg/dL (ref 7–25)
CO2: 28 mmol/L (ref 20–32)
Calcium: 9.5 mg/dL (ref 8.6–10.3)
Chloride: 104 mmol/L (ref 98–110)
Creat: 0.89 mg/dL (ref 0.70–1.25)
GFR, Est African American: 102 mL/min/{1.73_m2} (ref >=60)
GFR, Est Non African American: 88 mL/min/{1.73_m2} (ref >=60)
Globulin: 3.1 g/dL (calc) (ref 1.9–3.7)
Glucose, Bld: 151 mg/dL — ABNORMAL HIGH (ref 65–99)
Potassium: 4.2 mmol/L (ref 3.5–5.3)
Sodium: 142 mmol/L (ref 135–146)
Total Bilirubin: 0.5 mg/dL (ref 0.2–1.2)
Total Protein: 7.1 g/dL (ref 6.1–8.1)

## 2019-06-05 LAB — LIPID PANEL
Cholesterol: 161 mg/dL (ref <200)
HDL: 49 mg/dL (ref >=40)
LDL Cholesterol (Calc): 83 mg/dL (calc)
Non-HDL Cholesterol (Calc): 112 mg/dL (calc) (ref <130)
Total CHOL/HDL Ratio: 3.3 (calc) (ref <5.0)
Triglycerides: 196 mg/dL — ABNORMAL HIGH (ref <150)

## 2019-06-05 LAB — HEMOGLOBIN A1C
Hgb A1c MFr Bld: 10.9 % of total Hgb — ABNORMAL HIGH (ref <5.7)
Mean Plasma Glucose: 266 (calc)
eAG (mmol/L): 14.7 (calc)

## 2019-06-05 MED ORDER — FARXIGA 10 MG PO TABS: 10.0000 mg | ORAL_TABLET | Freq: Every day | ORAL | 1 refills | Status: DC

## 2019-06-05 NOTE — Patient Instructions (Addendum)
Raymond Huff,   Thank you for coming in to clinic today.  1. Work to reduce sugars in your diet.  Choose a sugar free protein drink.  Avoid mixing this with ice cream.  Use water instead.    Please schedule a follow-up appointment with Cassell Smiles, AGNP. Return in about 3 months (around 09/05/2019) for diabetes with A1c prior to visit if not in person.  If you have any other questions or concerns, please feel free to call the clinic or send a message through [place]. You may also schedule an earlier appointment if necessary.  You will receive a survey after today's visit either digitally by e-mail or paper by [employer]. Your experiences and feedback matter to [place].  Please respond so we know how we are doing as we provide care for you.   Cassell Smiles, DNP, AGNP-BC Adult Gerontology Nurse Practitioner [facility], [facility]      Low Glycemic Diet Make healthier carb choices by substituting low and moderate glycemic foods for your high glycemic foods.  Low Glycemic Foods (20-49) Best Carb Choices are higher fiber Goal: 4-5 per day  Breakfast Cereals: All-Bran                   All-Bran Fruit 'n Oats Fiber One                Oatmeal (not instant) Oat bran Fruits and fruit juices: (Limit to 1-2 servings per day) Apples                     Apricots (fresh & dried) Blackberries            Blueberries Cherries                  Cranberries             Peaches                  Pears                      Plums                      Prunes Grapefruit                Raspberries            Strawberries           Tangerine  Apple juice             Grapefruit juice Tomato juice  Beans and legumes (fresh-cooked or canned): Black-eyed peas    Butter beans Chick peas             Lentils              Pintos Green beans          Lima beans              Kidney beans         Navy beans  Non-starchy vegetables (not carbs) Asparagus, avocado, broccoli, cabbage, cauliflower, celery,  cucumber, greens, lettuce, mushrooms, peppers, tomatoes, okra, olives, onions, snow peas, spinach, summer squash Grains: Barley                                Bulgur Rye  Wild rice Nuts and oils: Almonds        Peanuts       Sunflower seeds           Hazelnuts      Pecans         Walnuts Oils that are liquid at room temperature Dairy, fish, meat, and eggs  (not carbs except for milk) Milk, skim (Carb)               Lowfat cheese Yogurt, lowfat, fruit sugar sweetened Lean red meat                      Fish  Skinless chicken & Malaw[place]urkey    Shellfish Limit egg yolks to 7 to 10 per week Egg whites - any reasonable amount Moderate Glycemic Foods (50-69)  Limit, but good options for nutrition Goal: 1-2 per day  Breakfast Cereals: Bran Buds                        Bran Chex Just Right                             Mini-Wheats                         Special K                              Swiss muesli Grape Nuts              Cheerios Shredded Wheat  Fruits: Banana (under-ripe)             Dates Figs                                      Grapes Kiwi                                      Mango Oranges                               Raisins Cantaloupe                          Other melons  Fruit Juices: Cranberry juice                    Orange juice  Beans and legumes: [place]-type baked beans, pork and beans Green peas  Vegetables: Beets                         Carrots  Sweet potato              Yams Corn on the cob  Breads: Pita (pocket) bread            Oat bran bread Pumpernickel bread           Rye bread Wheat bread, high fiber       Grains: Cornmeal  Rice, brown                 Rice, white                         Couscous  Pasta: Macaroni                            Pizza, cheese   Ravioli, meat filled             Spaghetti, white         Nuts: Cashews                 Macadamia  Snacks: Chocolate                Ice  cream, lowfat                                  Popcorn High Glycemic Foods (70-100)  Worst carb choices - AVOID Goal: 1-2 per month   Breakfast Cereals: Corn Chex  Nutri-Grain Corn Flakes            Cream of Wheat Grape Nut Flakes  Team Grits                        Total Puffed Rice             Puffed Wheat Rice Chex               Rice Krispies Life                         Fruits: Pineapple                 Watermelon Banana (over-ripe)  Beverages: Sodas, sweet tea, pineapple juice  Vegetables: Potato, baked, boiled, fried, mashed JamaicaFrench fries Canned or frozen corn Parsnips Winter squash  Breads: Most breads (white and whole grain) Bagels                    Bread sticks Bread stuffing         Kaiser roll Dinner rolls  Grains: Rice, instant           Tapioca, with milk  Candy and cookies  Snacks: Donuts                    Corn chips                  Jelly beans             Pretzels Pastries                             Restaurant and ethnic foods Most Congohinese food (sugar in stir fry or wok sauces) Teriyaki-style meats and vegetables

## 2019-06-05 NOTE — Progress Notes (Signed)
Telemedicine Encounter: Disclosed to patient at start of encounter that we will provide appropriate telemedicine services.  Patient consents to be treated via phone prior to discussion. - Patient is at his home and is accessed via telephone. - Services are provided by Wilhelmina McardleLauren Blessed Girdner from [facility].  Subjective:    Patient ID: Raymond Huff, male    DOB: 11-14-1950, 68 y.o.   MRN: [id number]  Raymond Huff is a 68 y.o. male presenting on 06/05/2019 for Diabetes  HPI Diabetes Pt presents today for follow up of Type 2 diabetes mellitus. He is checking fasting am CBG at home with a range of usual 135-79,98, Few highs to 300 at bedtime. - Current diabetic medications include:    Metformin XR 1000 mg bid, Januvia 100 mg daily, Lantus 35 units twice daily - He is not currently symptomatic.  - He denies polydipsia, polyphagia, polyuria, headaches, diaphoresis, shakiness, chills, pain, numbness or tingling in extremities and changes in vision.   - Clinical course has been improving. - He  reports no regular exercise routine. - His diet is moderate in salt, moderate in fat, and high in carbohydrates.  Patient is eating pasta about 3-4 days per week.  Patient also added 3 protein shakes per day with ice cream in them.  He is trying to gain weight.  Admits high fat foods cause GI upset/diarrhea s/p cholecystectomy and whipple - Weight trend: fluctuating a bit  PREVENTION: Eye exam current (within one year): no Foot exam current (within one year): no Lipid/ASCVD risk reduction - on statin: yes Kidney protection - on ace or arb: no - patient refuses  Recent Labs    11/26/18 1015 02/25/19 0854 06/04/19 0923  HGBA1C 9.7* 8.6* 10.9*    Social History   Tobacco Use  . Smoking status: Former Smoker    Packs/day: 1.00    Years: 48.00    Pack years: 48.00    Types: Cigarettes    Quit date: 02/21/2019    Years since quitting: 0.2  . Smokeless tobacco: Former NeurosurgeonUser  . Tobacco  comment: has quit x 2 for 3.5 years each  Substance Use Topics  . Alcohol use: Yes  . Drug use: Never    Review of Systems Per HPI unless specifically indicated above     Objective:    There were no vitals taken for this visit.  Wt Readings from Last 3 Encounters:  11/25/18 159 lb 6.4 oz (72.3 kg)    Physical Exam Patient remotely monitored.  Verbal communication appropriate.  Cognition normal.   Results for orders placed or performed in visit on 06/04/19  Lipid panel  Result Value Ref Range   Cholesterol 161 <200 mg/dL   HDL 49 > OR = 40 mg/dL   Triglycerides 409196 (H) <150 mg/dL   LDL Cholesterol (Calc) 83 mg/dL (calc)   Total CHOL/HDL Ratio 3.3 <5.0 (calc)   Non-HDL Cholesterol (Calc) 112 <130 mg/dL (calc)  Hemoglobin W1XA1c  Result Value Ref Range   Hgb A1c MFr Bld 10.9 (H) <5.7 % of total Hgb   Mean Plasma Glucose 266 (calc)   eAG (mmol/L) 14.7 (calc)  COMPLETE METABOLIC PANEL WITH GFR  Result Value Ref Range   Glucose, Bld 151 (H) 65 - 99 mg/dL   BUN 15 7 - 25 mg/dL   Creat 9.140.89 7.820.70 - 9.561.25 mg/dL   GFR, Est Non African American 88 > OR = 60 mL/min/1.9373m2   GFR, Est African American 102 > OR = 60  mL/min/1.29m2   BUN/Creatinine Ratio NOT APPLICABLE 6 - 22 (calc)   Sodium 142 135 - 146 mmol/L   Potassium 4.2 3.5 - 5.3 mmol/L   Chloride 104 98 - 110 mmol/L   CO2 28 20 - 32 mmol/L   Calcium 9.5 8.6 - 10.3 mg/dL   Total Protein 7.1 6.1 - 8.1 g/dL   Albumin 4.0 3.6 - 5.1 g/dL   Globulin 3.1 1.9 - 3.7 g/dL (calc)   AG Ratio 1.3 1.0 - 2.5 (calc)   Total Bilirubin 0.5 0.2 - 1.2 mg/dL   Alkaline phosphatase (APISO) 114 35 - 144 U/L   AST 15 10 - 35 U/L   ALT 13 9 - 46 U/L      Assessment & Plan:   Problem List Items Addressed This Visit      Endocrine   Uncontrolled type 2 diabetes mellitus with hyperglycemia (HCC) - Primary   Relevant Medications   empagliflozin (JARDIANCE) 10 MG TABS tablet    Uncontrolled T2DM with A1c increased from last visit, currently  over 10% and goal A1c < 7.0%. - Complications - hyperglycemia.  Plan:  1. Change therapy:  - Continue metformin, Januvia, Lantus - START SGLT2 inhibitor: Start Jardiance 10 mg once daily - Wilder Glade is non-preferred. 2. Encourage improved lifestyle: - low carb/low glycemic diet reinforced prior education - Increase physical activity to 30 minutes most days of the week.  Explained that increased physical activity increases body's use of sugar for energy. 3. Check fasting am CBG and bring log to next visit for review 4. Continue ASA and Statin 5. Advised to schedule DM ophtho exam, send record. 6. Follow-up 3 months    Meds ordered this encounter  Medications  . DISCONTD: dapagliflozin propanediol (FARXIGA) 10 MG TABS tablet    Sig: Take 10 mg by mouth daily before breakfast.    Dispense:  90 tablet    Refill:  1    Order Specific Question:   Supervising Provider    Answer:   Olin Hauser [[id number]]  . empagliflozin (JARDIANCE) 10 MG TABS tablet    Sig: Take 10 mg by mouth daily before breakfast.    Dispense:  90 tablet    Refill:  1    Order Specific Question:   Supervising Provider    Answer:   Olin Hauser [[id number]]   - Time spent in direct consultation with patient via telemedicine about above concerns: 16 minutes  Follow up plan: Return in about 3 months (around 09/05/2019) for diabetes with A1c prior to visit if not in person.  Cassell Smiles, DNP, AGPCNP-BC Adult Gerontology Primary Care Nurse Practitioner [facility] 06/05/2019, 8:34 AM

## 2019-06-06 MED ORDER — JARDIANCE 10 MG PO TABS: 10.0000 mg | ORAL_TABLET | Freq: Every day | ORAL | 1 refills | Status: DC

## 2019-06-24 LAB — HM DIABETES EYE EXAM

## 2019-06-30 ENCOUNTER — Other Ambulatory Visit: Payer: Self-pay | Admitting: Nurse Practitioner

## 2019-08-15 ENCOUNTER — Other Ambulatory Visit: Payer: Self-pay | Admitting: Family Medicine

## 2019-08-17 ENCOUNTER — Other Ambulatory Visit: Payer: Self-pay | Admitting: Nurse Practitioner

## 2019-09-09 ENCOUNTER — Ambulatory Visit: Payer: Medicare Other | Admitting: Nurse Practitioner

## 2019-11-15 ENCOUNTER — Other Ambulatory Visit: Payer: Self-pay | Admitting: Nurse Practitioner

## 2019-11-15 DIAGNOSIS — E1165 Type 2 diabetes mellitus with hyperglycemia: Secondary | ICD-10-CM

## 2019-12-07 DIAGNOSIS — R918 Other nonspecific abnormal finding of lung field: Secondary | ICD-10-CM | POA: Insufficient documentation

## 2019-12-08 ENCOUNTER — Telehealth: Payer: Self-pay | Admitting: Nurse Practitioner

## 2019-12-08 DIAGNOSIS — I2721 Secondary pulmonary arterial hypertension: Secondary | ICD-10-CM | POA: Insufficient documentation

## 2019-12-08 DIAGNOSIS — R0609 Other forms of dyspnea: Secondary | ICD-10-CM | POA: Insufficient documentation

## 2019-12-08 DIAGNOSIS — R0600 Dyspnea, unspecified: Secondary | ICD-10-CM | POA: Insufficient documentation

## 2019-12-08 NOTE — Telephone Encounter (Signed)
I left a message asking the patient to call and schedule AWV-I with Tiffany. VDM (DD) °

## 2019-12-09 ENCOUNTER — Ambulatory Visit (INDEPENDENT_AMBULATORY_CARE_PROVIDER_SITE_OTHER): Payer: Medicare Other | Admitting: Family Medicine

## 2019-12-09 ENCOUNTER — Other Ambulatory Visit: Payer: Self-pay

## 2019-12-09 ENCOUNTER — Encounter: Payer: Self-pay | Admitting: Family Medicine

## 2019-12-09 DIAGNOSIS — Z20818 Contact with and (suspected) exposure to other bacterial communicable diseases: Secondary | ICD-10-CM

## 2019-12-09 MED ORDER — DOXYCYCLINE HYCLATE 100 MG PO TABS: 100.0000 mg | ORAL_TABLET | Freq: Two times a day (BID) | ORAL | 0 refills | Status: DC

## 2019-12-09 NOTE — Progress Notes (Signed)
Virtual Visit via Telephone The purpose of this virtual visit is to provide medical care while limiting exposure to the novel coronavirus (COVID19) for both patient and office staff.  Consent was obtained for phone visit:  Yes.   Answered questions that patient had about telehealth interaction:  Yes.   I discussed the limitations, risks, security and privacy concerns of performing an evaluation and management service by telephone. I also discussed with the patient that there may be a patient responsible charge related to this service. The patient expressed understanding and agreed to proceed.  Patient Location: Home Provider Location: [facility])  ---------------------------------------------------------------------- Chief Complaint  Patient presents with  . Staphylococcus aureus exposure    as per patient wife was positive and has on antibiotics from last week     S: Reviewed CMA documentation. I have called patient and gathered additional HPI as follows:  Exposure to MRSA Recent exposure to Staph infection, confirmed on wound culture from wife. She was treated with Doxycycline with improvement. Her doctors asked her close contacts at home to be treated as well. - He does not have current active infection  Denies any high risk travel to areas of current concern for COVID19. Denies any known or suspected exposure to person with or possibly with COVID19.  Denies any fevers, chills, ulceration, rash, drainage of pus, sweats, body ache, cough, shortness of breath, sinus pain or pressure, headache, abdominal pain, diarrhea   Past Medical History:  Diagnosis Date  . Diabetes mellitus without complication (HCC)   . Hyperlipidemia    Social History   Tobacco Use  . Smoking status: Former Smoker    Packs/day: 1.00    Years: 48.00    Pack years: 48.00    Types: Cigarettes    Quit date: 02/21/2019    Years since quitting: 0.7  . Smokeless tobacco: Former Neurosurgeon   . Tobacco comment: has quit x 2 for 3.5 years each  Substance Use Topics  . Alcohol use: Yes  . Drug use: Never    Current Outpatient Medications:  .  aspirin EC 81 MG tablet, Take 81 mg by mouth daily. , Disp: , Rfl:  .  cyclobenzaprine (FLEXERIL) 5 MG tablet, Take 5 mg by mouth 3 (three) times daily as needed for muscle spasms., Disp: , Rfl:  .  esomeprazole (NEXIUM) 40 MG capsule, TAKE 1 CAPSULE DAILY BEFORE BREAKFAST, Disp: 90 capsule, Rfl: 3 .  Fluticasone-Umeclidin-Vilant 100-62.5-25 MCG/INH AEPB, Inhale into the lungs., Disp: , Rfl:  .  glucose blood (FREESTYLE LITE) test strip, Use as instructed, Disp: 100 each, Rfl: 1 .  insulin glargine (LANTUS) 100 UNIT/ML injection, Inject 35 Units into the skin 2 (two) times daily., Disp: , Rfl:  .  JANUVIA 100 MG tablet, TAKE 1 TABLET DAILY, Disp: 90 tablet, Rfl: 1 .  JARDIANCE 10 MG TABS tablet, TAKE 1 TABLET DAILY BEFORE BREAKFAST, Disp: 90 tablet, Rfl: 1 .  lidocaine (LIDODERM) 5 %, UNW AND APP 1 PA TO THE MOST PAINFUL AREA D FOR UP TO 12 H IN A 24 H PERIOD FOR 30 DAYS, Disp: , Rfl:  .  metFORMIN (GLUCOPHAGE-XR) 500 MG 24 hr tablet, TAKE 2 TABLETS(1000 MG) BY MOUTH TWICE DAILY, Disp: 360 tablet, Rfl: 1 .  metoprolol tartrate (LOPRESSOR) 25 MG tablet, TAKE 1 TABLET DAILY, Disp: 90 tablet, Rfl: 3 .  Multiple Vitamin (MULTIVITAMIN) tablet, Take 1 tablet by mouth daily., Disp: , Rfl:  .  Omega-3 Fatty Acids (FISH OIL) 500 MG  CAPS, Take 500 mg by mouth 2 (two) times daily. , Disp: , Rfl:  .  Pancrelipase, Lip-Prot-Amyl, 24000-76000 units CPEP, Take 24,000 Units by mouth 3 (three) times daily. Verify dose prior to prescribing (LK 11/25/2018).  Patient states he takes 5-7 tabs tid with meals, Disp: , Rfl:  .  simvastatin (ZOCOR) 40 MG tablet, TAKE 1 TABLET DAILY, Disp: 90 tablet, Rfl: 3 .  doxycycline (VIBRA-TABS) 100 MG tablet, Take 1 tablet (100 mg total) by mouth 2 (two) times daily. For 7 days. Take with full glass of water, stay upright 30 min  after taking., Disp: 14 tablet, Rfl: 0  Depression screen [facility] 2/9 12/09/2019 02/27/2019  Decreased Interest 0 0  Down, Depressed, Hopeless 0 0  PHQ - 2 Score 0 0    No flowsheet data found.  -------------------------------------------------------------------------- O: No physical exam performed due to remote telephone encounter.  Lab results reviewed.  No results found for this or any previous visit (from the past 2160 hour(s)).  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 Take with full glass of water and stay upright for at least 30 min after taking, may be seated or standing, but should NOT lay down. This is just a safety precaution, if this medicine does not go all the way down throat well it could cause some burning discomfort to throat and esophagus.   Meds ordered this encounter  Medications  . doxycycline (VIBRA-TABS) 100 MG tablet    Sig: Take 1 tablet (100 mg total) by mouth 2 (two) times daily. For 7 days. Take with full glass of water, stay upright 30 min after taking.    Dispense:  14 tablet    Refill:  0    Follow-up: - Return as needed  Patient verbalizes understanding with the above medical recommendations including the limitation of remote medical advice.  Specific follow-up and call-back criteria were given for patient to follow-up or seek medical care more urgently if needed.   - Time spent in direct consultation with patient on phone: 8 minutes  Nobie Putnam, [facility] 12/09/2019, 2:53 PM

## 2019-12-09 NOTE — Patient Instructions (Addendum)
Start taking Doxycycline antibiotic 100mg  twice daily for 7 days. Take with full glass of water and stay upright for at least 30 min after taking, may be seated or standing, but should NOT lay down. This is just a safety precaution, if this medicine does not go all the way down throat well it could cause some burning discomfort to throat and esophagus.   Please schedule a Follow-up Appointment to: Return if symptoms worsen or fail to improve.  If you have any other questions or concerns, please feel free to call the office or send a message through MyChart. You may also schedule an earlier appointment if necessary.  Additionally, you may be receiving a survey about your experience at our office within a few days to 1 week by e-mail or mail. We value your feedback.  , DO [facility]

## 2019-12-10 ENCOUNTER — Ambulatory Visit: Payer: Medicare Other | Admitting: Family Medicine

## 2019-12-27 ENCOUNTER — Other Ambulatory Visit: Payer: Self-pay | Admitting: Nurse Practitioner

## 2019-12-27 DIAGNOSIS — E1165 Type 2 diabetes mellitus with hyperglycemia: Secondary | ICD-10-CM

## 2019-12-29 MED ORDER — SITAGLIPTIN PHOSPHATE 100 MG PO TABS: 100.0000 mg | ORAL_TABLET | Freq: Every day | ORAL | 2 refills | Status: DC

## 2020-02-04 ENCOUNTER — Ambulatory Visit: Payer: Self-pay

## 2020-02-04 ENCOUNTER — Ambulatory Visit (INDEPENDENT_AMBULATORY_CARE_PROVIDER_SITE_OTHER): Payer: Medicare Other | Admitting: Family Medicine

## 2020-02-04 ENCOUNTER — Encounter: Payer: Self-pay | Admitting: Family Medicine

## 2020-02-04 ENCOUNTER — Other Ambulatory Visit: Payer: Self-pay

## 2020-02-04 ENCOUNTER — Other Ambulatory Visit: Payer: Self-pay | Admitting: Family Medicine

## 2020-02-04 VITALS — BP 77/46 | HR 62 | Temp 97.8°F | Resp 16 | Ht 69.0 in | Wt 143.0 lb

## 2020-02-04 DIAGNOSIS — K8689 Other specified diseases of pancreas: Secondary | ICD-10-CM

## 2020-02-04 DIAGNOSIS — R0781 Pleurodynia: Secondary | ICD-10-CM | POA: Diagnosis not present

## 2020-02-04 DIAGNOSIS — M792 Neuralgia and neuritis, unspecified: Secondary | ICD-10-CM | POA: Diagnosis not present

## 2020-02-04 MED ORDER — GABAPENTIN 100 MG PO CAPS: ORAL_CAPSULE | ORAL | 0 refills | Status: DC

## 2020-02-04 MED ORDER — PANCRELIPASE (LIP-PROT-AMYL) 24000-76000 UNITS PO CPEP: 5.0000 | ORAL_CAPSULE | Freq: Three times a day (TID) | ORAL | 1 refills | Status: AC

## 2020-02-04 NOTE — Telephone Encounter (Signed)
Patient called and stated that he has had rt rib/ upper abdomin pain for at least 1 week. He states that the pain is 10 when he touches his right side ribs. He denies injury. He has Hx of pancreatic pre Cancer and surgery. He has no fever or other symptoms. His son has appointment today and he is requesting to share this appointment with his son. Call placed to office. Spoke with Fleet Contras and transferred call.   Reason for Disposition . [1] MILD-MODERATE pain AND [2] constant AND [3] present > 2 hours  Answer Assessment - Initial Assessment Questions 1. LOCATION: "Where does it hurt?"      Upper middle rib area Rt ide 2. RADIATION: "Does the pain shoot anywhere else?" (e.g., chest, back)     no 3. ONSET: "When did the pain begin?" (e.g., minutes, hours or days ago)      Past week 4. SUDDEN: "Gradual or sudden onset?"    Gradual 5. PATTERN "Does the pain come and go, or is it constant?"    - If constant: "Is it getting better, staying the same, or worsening?"      (Note: Constant means the pain never goes away completely; most serious pain is constant and it progresses)     - If intermittent: "How long does it last?" "Do you have pain now?"     (Note: Intermittent means the pain goes away completely between bouts)    When apply pressure 6. SEVERITY: "How bad is the pain?"  (e.g., Scale 1-10; mild, moderate, or severe)    - MILD (1-3): doesn't interfere with normal activities, abdomen soft and not tender to touch     - MODERATE (4-7): interferes with normal activities or awakens from sleep, tender to touch     - SEVERE (8-10): excruciating pain, doubled over, unable to do any normal activities      10 7. RECURRENT SYMPTOM: "Have you ever had this type of abdominal pain before?" If so, ask: "When was the last time?" and "What happened that time?"    Never 8. AGGRAVATING FACTORS: "Does anything seem to cause this pain?" (e.g., foods, stress, alcohol)     No just pressure to the area 9. CARDIAC  SYMPTOMS: "Do you have any of the following symptoms: chest pain, difficulty breathing, sweating, nausea?"     none 10. OTHER SYMPTOMS: "Do you have any other symptoms?" (e.g., fever, vomiting, diarrhea)      No 11. PREGNANCY: "Is there any chance you are pregnant?" "When was your last menstrual period?"     N/A  Protocols used: ABDOMINAL PAIN - UPPER-A-AH

## 2020-02-04 NOTE — Patient Instructions (Addendum)
Thank you for coming to the office today.  Recommend to start taking Tylenol Extra Strength 500mg  tabs - take 1 to 2 tabs per dose (max 1000mg ) every 6-8 hours for pain (take regularly, don't skip a dose for next 7 days), max 24 hour daily dose is 6 tablets or 3000mg . In the future you can repeat the same everyday Tylenol course for 1-2 weeks at a time.   Start Gabapentin 100mg  capsules, take at night for 2-3 nights only, and then increase to 2 times a day for a few days, and then may increase to 3 times a day, it may make you drowsy, if helps significantly at night only, then you can increase instead to 3 capsules at night, instead of 3 times a day - In the future if needed, we can significantly increase the dose if tolerated well, some common doses are 300mg  three times a day up to 600mg  three times a day, usually it takes several weeks or months to get to higher doses  Try OTC Voltaren arthritis pain relief cream.  You may try the existing rx of Flexeril (cyclobenzaprine) muscle relaxant if you need, half or whole pill up to 1-2 times a day as needed. Caution sedation   Please schedule a Follow-up Appointment to: Return in about 2 weeks (around 02/18/2020), or if symptoms worsen or fail to improve, for Rib pain.  If you have any other questions or concerns, please feel free to call the office or send a message through MyChart. You may also schedule an earlier appointment if necessary.  Additionally, you may be receiving a survey about your experience at our office within a few days to 1 week by e-mail or mail. We value your feedback.  , DO [facility], 

## 2020-02-04 NOTE — Progress Notes (Signed)
Subjective:    Patient ID: Raymond Huff, male    DOB: [birth year], 69 y.o.   MRN: [id number]  Raymond Huff is a 69 y.o. male presenting on 02/04/2020 for rib cage pain (muscle pulled as per patient right side onset past week and half)  Patient presents for a same day appointment.  HPI   Right Rib Pain / Chest Wall Pain Reports onset noticed about 1 week ago, was sore at the time, no obvious injury. He has been doing some lifting of some heavy bags for a period of time but he did not think he injured himself. Did not recall any other events. - He describes a severe 10 out of 10 burning stabbing pain brief acute episode if touching the spot on rib, otherwise does not bother him at rest, not provoked by deep breathing, talking, turning twisting - No prior similar episodes - Not taking OTC meds, has NSAID Tylenol, Flexeril. Not tried yet. Used to take gabapentin for other reason in past. Denies any rash or laceration or abrasion, no trauma or fall, dyspnea, coughing  Hypotension Chronic problem has been stable in range 80-90s/50-60s. He has documentation of BP readings for several years at [facility], and our last reading 11/2018 was same range. He has no dizziness or lightheaded  Health Maintenance: UTD Shingles Vaccine  Depression screen [facility] 2/9 02/04/2020 12/09/2019 02/27/2019  Decreased Interest 0 0 0  Down, Depressed, Hopeless 0 0 0  PHQ - 2 Score 0 0 0    Social History   Tobacco Use  . Smoking status: Current Every Day Smoker    Packs/day: 1.00    Years: 48.00    Pack years: 48.00    Types: Cigarettes    Last attempt to quit: 02/21/2019    Years since quitting: 0.9  . Smokeless tobacco: Current User  . Tobacco comment: has quit x 2 for 3.5 years each  Substance Use Topics  . Alcohol use: Yes  . Drug use: Never    Review of Systems Per HPI unless specifically indicated above     Objective:    BP (!) 77/46   Pulse 62   Temp 97.8 F (36.6 C) (Temporal)   Resp 16   Ht  5\' 9"  (1.753 m)   Wt 143 lb (64.9 kg)   BMI 21.12 kg/m   Wt Readings from Last 3 Encounters:  02/04/20 143 lb (64.9 kg)  11/25/18 159 lb 6.4 oz (72.3 kg)    Physical Exam Vitals and nursing note reviewed.  Constitutional:      General: He is not in acute distress.    Appearance: He is well-developed. He is not diaphoretic.     Comments: Well-appearing thin, comfortable, cooperative  HENT:     Head: Normocephalic and atraumatic.  Eyes:     General:        Right eye: No discharge.        Left eye: No discharge.     Conjunctiva/sclera: Conjunctivae normal.  Neck:     Thyroid: No thyromegaly.  Cardiovascular:     Rate and Rhythm: Normal rate and regular rhythm.     Heart sounds: Normal heart sounds. No murmur.  Pulmonary:     Effort: Pulmonary effort is normal. No respiratory distress.     Breath sounds: Normal breath sounds. No wheezing or rales.  Musculoskeletal:        General: Normal range of motion.     Cervical back: Normal range of motion and neck  supple.  Lymphadenopathy:     Cervical: No cervical adenopathy.  Skin:    General: Skin is warm and dry.     Findings: No erythema or rash.  Neurological:     Mental Status: He is alert and oriented to person, place, and time.  Psychiatric:        Behavior: Behavior normal.     Comments: Well groomed, good eye contact, normal speech and thoughts    Results for orders placed or performed in visit on 06/04/19  Lipid panel  Result Value Ref Range   Cholesterol 161 <200 mg/dL   HDL 49 > OR = 40 mg/dL   Triglycerides 371 (H) <150 mg/dL   LDL Cholesterol (Calc) 83 mg/dL (calc)   Total CHOL/HDL Ratio 3.3 <5.0 (calc)   Non-HDL Cholesterol (Calc) 112 <130 mg/dL (calc)  Hemoglobin G6Y  Result Value Ref Range   Hgb A1c MFr Bld 10.9 (H) <5.7 % of total Hgb   Mean Plasma Glucose 266 (calc)   eAG (mmol/L) 14.7 (calc)  COMPLETE METABOLIC PANEL WITH GFR  Result Value Ref Range   Glucose, Bld 151 (H) 65 - 99 mg/dL   BUN 15 7 -  25 mg/dL   Creat 6.94 8.54 - 6.27 mg/dL   GFR, Est Non African American 88 > OR = 60 mL/min/1.44m2   GFR, Est African American 102 > OR = 60 mL/min/1.57m2   BUN/Creatinine Ratio NOT APPLICABLE 6 - 22 (calc)   Sodium 142 135 - 146 mmol/L   Potassium 4.2 3.5 - 5.3 mmol/L   Chloride 104 98 - 110 mmol/L   CO2 28 20 - 32 mmol/L   Calcium 9.5 8.6 - 10.3 mg/dL   Total Protein 7.1 6.1 - 8.1 g/dL   Albumin 4.0 3.6 - 5.1 g/dL   Globulin 3.1 1.9 - 3.7 g/dL (calc)   AG Ratio 1.3 1.0 - 2.5 (calc)   Total Bilirubin 0.5 0.2 - 1.2 mg/dL   Alkaline phosphatase (APISO) 114 35 - 144 U/L   AST 15 10 - 35 U/L   ALT 13 9 - 46 U/L      Assessment & Plan:   Problem List Items Addressed This Visit    None    Visit Diagnoses    Rib pain on right side    -  Primary   Relevant Medications   gabapentin (NEURONTIN) 100 MG capsule   Neuropathic pain       Relevant Medications   gabapentin (NEURONTIN) 100 MG capsule     Clinically very localized reproducible sharp neuropathic type pain localized to mid R rib/flank Likely pinched nerve or muscle spasm/strain  Relatively acute in past 1 week, without worsening. No other concerning features or symptoms. No exertional symptoms. No respiratory concerns. No trauma or injury. No rash or evidence of shingles, s/p shingrix  Reassurance today Defer further testing. Defer X-ray at this time, suspected to be low yield Will treat symptomatically and monitor.  Rx Gabapentin for neuropathic component Trial OTC voltaren, Tylenol PRN Follow-up return criteria given.  Meds ordered this encounter  Medications  . gabapentin (NEURONTIN) 100 MG capsule    Sig: Start 1 capsule daily, increase by 1 cap every 2-3 days as tolerated up to 3 times a day, or may take 3 at once in evening.    Dispense:  90 capsule    Refill:  0      Follow up plan: Return in about 2 weeks (around 02/18/2020), or if symptoms worsen or fail  to improve, for Rib pain.   Saralyn Pilar, DO [facility] 02/04/2020, 1:54 PM

## 2020-02-24 ENCOUNTER — Ambulatory Visit
Admission: RE | Admit: 2020-02-24 | Discharge: 2020-02-24 | Disposition: A | Discharge status: Home / Self Care | Payer: Medicare Other | Attending: Family Medicine | Admitting: Family Medicine

## 2020-02-24 ENCOUNTER — Ambulatory Visit
Admission: RE | Admit: 2020-02-24 | Discharge: 2020-02-24 | Disposition: A | Discharge status: Home / Self Care | Payer: Medicare Other | Source: Ambulatory Visit | Attending: Family Medicine | Admitting: Family Medicine

## 2020-02-24 ENCOUNTER — Other Ambulatory Visit: Payer: Self-pay

## 2020-02-24 ENCOUNTER — Ambulatory Visit (INDEPENDENT_AMBULATORY_CARE_PROVIDER_SITE_OTHER): Payer: Medicare Other | Admitting: Family Medicine

## 2020-02-24 ENCOUNTER — Encounter: Payer: Self-pay | Admitting: Family Medicine

## 2020-02-24 VITALS — BP 90/51 | HR 79 | Temp 97.3°F | Ht 69.0 in | Wt 146.0 lb

## 2020-02-24 DIAGNOSIS — E1165 Type 2 diabetes mellitus with hyperglycemia: Secondary | ICD-10-CM | POA: Diagnosis not present

## 2020-02-24 DIAGNOSIS — E785 Hyperlipidemia, unspecified: Secondary | ICD-10-CM

## 2020-02-24 DIAGNOSIS — K219 Gastro-esophageal reflux disease without esophagitis: Secondary | ICD-10-CM

## 2020-02-24 DIAGNOSIS — R0781 Pleurodynia: Secondary | ICD-10-CM

## 2020-02-24 DIAGNOSIS — M792 Neuralgia and neuritis, unspecified: Secondary | ICD-10-CM

## 2020-02-24 DIAGNOSIS — E1169 Type 2 diabetes mellitus with other specified complication: Secondary | ICD-10-CM | POA: Diagnosis not present

## 2020-02-24 DIAGNOSIS — Z1159 Encounter for screening for other viral diseases: Secondary | ICD-10-CM

## 2020-02-24 DIAGNOSIS — Z79899 Other long term (current) drug therapy: Secondary | ICD-10-CM

## 2020-02-24 DIAGNOSIS — M542 Cervicalgia: Secondary | ICD-10-CM

## 2020-02-24 LAB — POCT GLYCOSYLATED HEMOGLOBIN (HGB A1C): Hemoglobin A1C: 8.5 % — AB (ref 4.0–5.6)

## 2020-02-24 LAB — POCT URINALYSIS DIPSTICK
Bilirubin, UA: NEGATIVE
Blood, UA: NEGATIVE
Glucose, UA: POSITIVE — AB
Ketones, UA: NEGATIVE
Leukocytes, UA: NEGATIVE
Nitrite, UA: NEGATIVE
Protein, UA: NEGATIVE
Spec Grav, UA: 1.02
Urobilinogen, UA: 0.2 U/dL
pH, UA: 5

## 2020-02-24 LAB — POCT UA - MICROALBUMIN: Microalbumin Ur, POC: 0 mg/L

## 2020-02-24 MED ORDER — METFORMIN HCL ER 500 MG PO TB24: ORAL_TABLET | ORAL | 1 refills | Status: DC

## 2020-02-24 MED ORDER — JARDIANCE 10 MG PO TABS: 10.0000 mg | ORAL_TABLET | Freq: Every day | ORAL | 1 refills | Status: DC

## 2020-02-24 MED ORDER — SITAGLIPTIN PHOSPHATE 100 MG PO TABS: 100.0000 mg | ORAL_TABLET | Freq: Every day | ORAL | 2 refills | Status: DC

## 2020-02-24 MED ORDER — SIMVASTATIN 40 MG PO TABS: 40.0000 mg | ORAL_TABLET | Freq: Every day | ORAL | 3 refills | Status: AC

## 2020-02-24 MED ORDER — CYCLOBENZAPRINE HCL 5 MG PO TABS: 5.0000 mg | ORAL_TABLET | Freq: Three times a day (TID) | ORAL | 1 refills | Status: AC | PRN

## 2020-02-24 MED ORDER — INSULIN GLARGINE 100 UNIT/ML ~~LOC~~ SOLN: 35.0000 [IU] | Freq: Two times a day (BID) | SUBCUTANEOUS | 1 refills | Status: AC

## 2020-02-24 MED ORDER — ESOMEPRAZOLE MAGNESIUM 40 MG PO CPDR: DELAYED_RELEASE_CAPSULE | ORAL | 3 refills | Status: AC

## 2020-02-24 NOTE — Assessment & Plan Note (Signed)
UncontrolledDM with A1c 8.5% improved from 10.9% and goal A1c < 7.0%. Complications: Hyperglycemia, GERD No hypoglycemia  Plan:  1. Continue current therapy: jardiance 10mg  daily, lantus 35 units twice daily, metformin XR 500mg  twice daily, januvia 100mg  daily 2. Encourage improved lifestyle: - low carb/low glycemic diet reinforced prior education - Increase physical activity to 30 minutes most days of the week.  Explained that increased physical activity increases body's use of sugar for energy. 3. Check fasting am CBG and log.  Bring log to next visit for review 4. Continue Statin 5. DM Foot exam done today with no acute findings.  Requesting medical records for diabetic eye exam with [facility]. 6. Follow-up 3 months

## 2020-02-24 NOTE — Assessment & Plan Note (Signed)
No known injury/fall, with right rib pain that is tender to palpation.  Patient denies difficulty breathing, SOB, interference with sleep patterns.  Requesting x-ray.  Plan: 1. Xray right ribs today

## 2020-02-24 NOTE — Assessment & Plan Note (Signed)
Stable and well controlled per last labs in 05/2019, new labs ordered today.  Currently taking simvastatin 40mg  daily and tolerating it well.   Plan: 1) Labs ordered today 2) Continue simvastatin 40mg  daily  3) Heart healthy diet and to exercise every other day for 30 minutes per day, going no more than 2 days in a row without exercise. 4) We will see you back in 3 months

## 2020-02-24 NOTE — Patient Instructions (Signed)
As we discussed, have your labs drawn in the next 1-2 weeks and we will contact you with the results.  Your medication refills have been sent to your pharmacy on file.  Advice on Protecting Your Feet   1. Daily foot inspections - Look for any breaks in the skin or areas of irritation such as blisters or red areas. Report to primary doctor or foot nurse immediately if any problems occur.  - If you have vision problems and cannot see your feet well or it is hard for you to reach your feet, ask a family member to inspect your feet daily.   2. Daily foot hygiene  - Wash feet daily, but do not soak your feet in hot water. If you do have callus, you may use warm water epsom salt foot soak and use moisturizer after. - Dry well especially between toes; Pat dry do not rub.  - If your skin is dry use a lotion to moisturize but never between the toes.  - If your skin is wet from perspiration, use an antifungal foot powder daily.   3. Shoes and Socks  - Wear a clean pair of socks daily.  - Make sure your shoes fit well and that you are measured and properly fit each time you purchase shoes. Your shoe size may change.  - Powder your shoes with a small amount of an antifungal foot powder daily, as the shoe is the only article of clothing that is not laundered.  - Wear appropriate shoes and socks for the weather. It is especially important to protect your feet from the cold; however, don't forget about sunscreen to the tops of your feet in the hot sun.  - Wear well fitting shoes rather than slippers or flip-flops when walking or standing for long period of time.  - When at home make sure you always have protective foot wear on your feet.     Eat at least 3 meals and 1-2 snacks per day (don't skip breakfast).  Aim for no more than 5 hours between eating. - Tip: If you go >5 hours without eating and become very hungry, your body will supply it's own resources temporarily and you can gain extra weight when  you eat.  The 5 Minute Rule of Exercise - Promise yourself to at least do 5 minutes of exercise (make sure you time it), and if at the end of 5 minutes (this is the hardest part of the work-out), if you still feel like you want stop (or not motivated to continue) then allow yourself to stop. Otherwise, more often than not you will feel encouraged that you can continue for a little while longer or even more!  My 5 to Fitness!  5: fruits and vegetables per day (work on 9 per day if you are at 5) 4: exercise 4-5 times per week for at least 30 minutes (walking counts!) 3: meals per day (don't skip breakfast!), no more than 5 hours between meals 2: habits to quit -smoking -excess alcohol use (men >2 beer/day; women >1beer/day) 1: sweet per day (2 cookies, 1 small cup of ice cream, 12 oz soda)  These are general tips for healthy living. Try to start with 1 or 2 habit TODAY and make it a part of your life for several months. You set a goal today to work on:  Once you have 1 or 2 habits down for several months, try to begin working on your next healthy habit. With every  single step you take, you will be leading a healthier lifestyle!   Diet Recommendations for Diabetes   Starchy (carb) foods include: Bread, rice, pasta, potatoes, corn, crackers, bagels, muffins, all baked goods.   Protein foods include: Meat, fish, poultry, eggs, dairy foods, and beans such as pinto and kidney beans (beans also provide carbohydrate).   1. Eat at least 3 meals and 1-2 snacks per day. Never go more than 4-5 hours while awake without eating.   2. Limit starchy foods to TWO per meal and ONE per snack. ONE portion of a starchy  food is equal to the following:   - ONE slice of bread (or its equivalent, such as half of a hamburger bun).   - 1/2 cup of a "scoopable" starchy food such as potatoes or rice.   - 1 OUNCE (28 grams) of starchy snacks (crackers or pretzels, look on label).   - 15 grams of carbohydrate as  shown on food label.   3. Both lunch and dinner should include a protein food, a carb food, and vegetables.   - Obtain twice as many veg's as protein or carbohydrate foods for both lunch and dinner.   - Try to keep frozen veg's on hand for a quick vegetable serving.     - Fresh or frozen veg's are best.   4. Breakfast should always include protein.    You can learn more information online about your diabetes at American Diabetes Association: http://www.diabetes.org/ - General self-care (diet, medications, blood sugar checks). - Diet recommendations - There are even recipes available for you to look at and try.  We will plan to see you back in 3 months for follow up on your diabetes  You will receive a survey after today's visit either digitally by e-mail or paper by [place] mail. Your experiences and feedback matter to [place].  Please respond so we know how we are doing as we provide care for you.  Call us with any questions/concerns/needs.  It is my goal to be available to you for your health concerns.  Thanks for choosing me to be a partner in your healthcare needs!  Harlin Rain, FNP-C Family Nurse Practitioner [facility] Phone: [phone]

## 2020-02-24 NOTE — Assessment & Plan Note (Signed)
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  Takes cyclobenzaprine as needed for muscle spasms near neck.  Plan: 1. Continue cyclobenzaprine 5mg  up to 3x prn daily for neck spasms/pain

## 2020-02-24 NOTE — Assessment & Plan Note (Signed)
Currently well controlled on esomeprazole 40mg  once daily.  Plan: 1. Continue esomeprazole 40mg  once daily. Side effects discussed. Pt wants to continue med. 2. Avoid diet triggers. Reviewed need to seek care if globus sensation, difficulty swallowing, s/sx of GI bleed. 3. Follow up as needed and in 6.

## 2020-02-24 NOTE — Progress Notes (Signed)
Subjective:    Patient ID: Raymond Huff, male    DOB: Sep 22, 1951, 69 y.o.   MRN: [id number]  Raymond Huff is a 69 y.o. male presenting on 02/24/2020 for Diabetes  HPI  Diabetes Pt presents today for follow up Type 2 Diabetes Mellitus.  He/she (caps): He ACTION; IS/IS NOT: is checking AM CBG at home with range of <90-170. -Current diabetic medications include: jardiance 10mg  daily, lantus 35 units 2x daily, metformin XR 500mg  tablet twice daily, and januvia 100mg  daily -ACTION; IS/IS NOT: is not currently symptomatic -Actions; denies/reports/admits to: denies polydipsia, polyphagia, polyuria, headaches, diaphoresis, shakiness, chills, pain, numbness or tingling in extremities or changes in vision -Clinical course has been improving  -Reports no structured exercise routine -Diet is moderate in salt, moderate in fat, and moderate in carbohydrates  PREVENTION Eye exam current (within 1 year) Reports with [facility] within the last 6 months Foot exam current (within 1 year) Completed today Lipid/ASCVD risk reduction - on statin: YES/NO: Yes  Kidney Protection (On ACE/ARB)? YES/NO: No    Mr. Scorsone reports he has some right rib tenderness to palpation.  Denies injury/trauma/fall or sudden traumatic event prior to discomfort happening.  Denies difficulty breathing, SOB, interference with sleep.     Depression screen [facility] 2/9 02/04/2020 12/09/2019 02/27/2019  Decreased Interest 0 0 0  Down, Depressed, Hopeless 0 0 0  PHQ - 2 Score 0 0 0    Social History   Tobacco Use  . Smoking status: Current Every Day Smoker    Packs/day: 0.50    Years: 48.00    Pack years: 24.00    Types: Cigarettes    Last attempt to quit: 02/21/2019    Years since quitting: 1.0  . Smokeless tobacco: Never Used  . Tobacco comment: has quit x 2 for 3.5 years each  Substance Use Topics  . Alcohol use: Yes    Comment: occasionally  . Drug use: Never    Review of Systems  Constitutional: Negative.   HENT: Negative.    Eyes: Negative.   Respiratory: Negative.   Cardiovascular: Negative.   Gastrointestinal: Negative.   Endocrine: Negative.   Genitourinary: Negative.   Musculoskeletal: Positive for myalgias. Negative for arthralgias, back pain, gait problem, joint swelling, neck pain and neck stiffness.       Right rib pain with palpation  Skin: Negative.   Allergic/Immunologic: Negative.   Neurological: Negative.   Hematological: Negative.   Psychiatric/Behavioral: Negative.    Per HPI unless specifically indicated above     Objective:    BP (!) 90/51 (BP Location: Left Arm, Patient Position: Sitting, Cuff Size: Normal)   Pulse 79   Temp (!) 97.3 F (36.3 C) (Temporal)   Ht 5\' 9"  (1.753 m)   Wt 146 lb (66.2 kg)   BMI 21.56 kg/m   Wt Readings from Last 3 Encounters:  02/24/20 146 lb (66.2 kg)  02/04/20 143 lb (64.9 kg)  11/25/18 159 lb 6.4 oz (72.3 kg)    Physical Exam Vitals reviewed.  Constitutional:      General: He is not in acute distress.    Appearance: Normal appearance. He is well-developed, well-groomed and normal weight. He is not ill-appearing or toxic-appearing.  HENT:     Head: Normocephalic.     Nose:     Comments: Facemask in place covering nose and mouth Eyes:     General: Lids are normal. Vision grossly intact.        Right eye: No discharge.  Left eye: No discharge.     Extraocular Movements: Extraocular movements intact.     Conjunctiva/sclera: Conjunctivae normal.     Pupils: Pupils are equal, round, and reactive to light.  Cardiovascular:     Rate and Rhythm: Normal rate and regular rhythm.     Pulses: Normal pulses.          Dorsalis pedis pulses are 2+ on the right side and 2+ on the left side.     Heart sounds: Normal heart sounds. No murmur. No friction rub. No gallop.   Pulmonary:     Effort: Pulmonary effort is normal. No respiratory distress.     Breath sounds: Normal breath sounds.  Chest:     Chest wall: Tenderness present. No mass,  lacerations, deformity, swelling, crepitus or edema.       Comments: See image.  Tenderness to light palpation. Abdominal:     General: Abdomen is flat. Bowel sounds are normal. There is no distension.     Palpations: Abdomen is soft.     Tenderness: There is no abdominal tenderness.  Musculoskeletal:        General: Tenderness present.     Right lower leg: No edema.     Left lower leg: No edema.  Feet:     Right foot:     Skin integrity: Skin integrity normal.     Toenail Condition: Fungal disease present.    Left foot:     Skin integrity: Skin integrity normal.     Toenail Condition: Fungal disease present.    Comments: Absent of callus and all other lesions.  Some dry skin noted, but pt reports regular foot care and can see bottom of his feet.  Skin:    General: Skin is warm and dry.     Capillary Refill: Capillary refill takes less than 2 seconds.  Neurological:     General: No focal deficit present.     Mental Status: He is alert and oriented to person, place, and time.     Cranial Nerves: No cranial nerve deficit.     Sensory: No sensory deficit.     Motor: No weakness.     Coordination: Coordination normal.     Gait: Gait normal.  Psychiatric:        Attention and Perception: Attention and perception normal.        Mood and Affect: Mood and affect normal.        Speech: Speech normal.        Behavior: Behavior normal. Behavior is cooperative.        Thought Content: Thought content normal.        Cognition and Memory: Cognition and memory normal.        Judgment: Judgment normal.     Results for orders placed or performed in visit on 02/24/20  POCT glycosylated hemoglobin (Hb A1C)  Result Value Ref Range   Hemoglobin A1C 8.5 (A) 4.0 - 5.6 %   HbA1c POC (<> result, manual entry)     HbA1c, POC (prediabetic range)     HbA1c, POC (controlled diabetic range)    POCT Urinalysis Dipstick  Result Value Ref Range   Color, UA Yellow    Clarity, UA Clear    Glucose,  UA Positive (A) Negative   Bilirubin, UA negative    Ketones, UA negative    Spec Grav, UA 1.020 1.010 - 1.025   Blood, UA negative    pH, UA 5.0 5.0 - 8.0  Protein, UA Negative Negative   Urobilinogen, UA 0.2 0.2 or 1.0 E.U./dL   Nitrite, UA negative    Leukocytes, UA Negative Negative   Appearance     Odor    POCT UA - Microalbumin  Result Value Ref Range   Microalbumin Ur, POC 0 mg/L   Creatinine, POC     Albumin/Creatinine Ratio, Urine, POC        Assessment & Plan:   Problem List Items Addressed This Visit      Digestive   Gastroesophageal reflux disease    Currently well controlled on esomeprazole 40mg  once daily.  Plan: 1. Continue esomeprazole 40mg  once daily. Side effects discussed. Pt wants to continue med. 2. Avoid diet triggers. Reviewed need to seek care if globus sensation, difficulty swallowing, s/sx of GI bleed. 3. Follow up as needed and in 6.       Relevant Medications   esomeprazole (NEXIUM) 40 MG capsule     Endocrine   Uncontrolled type 2 diabetes mellitus with hyperglycemia (HCC) - Primary    UncontrolledDM with A1c 8.5% improved from 10.9% and goal A1c < 7.0%. Complications: Hyperglycemia, GERD No hypoglycemia  Plan:  1. Continue current therapy: jardiance 10mg  daily, lantus 35 units twice daily, metformin XR 500mg  twice daily, januvia 100mg  daily 2. Encourage improved lifestyle: - low carb/low glycemic diet reinforced prior education - Increase physical activity to 30 minutes most days of the week.  Explained that increased physical activity increases body's use of sugar for energy. 3. Check fasting am CBG and log.  Bring log to next visit for review 4. Continue Statin 5. DM Foot exam done today with no acute findings.  Requesting medical records for diabetic eye exam with [facility]. 6. Follow-up 3 months       Relevant Medications   insulin glargine (LANTUS) 100 UNIT/ML injection   empagliflozin (JARDIANCE) 10 MG TABS tablet    metFORMIN (GLUCOPHAGE-XR) 500 MG 24 hr tablet   simvastatin (ZOCOR) 40 MG tablet   sitaGLIPtin (JANUVIA) 100 MG tablet   Other Relevant Orders   POCT glycosylated hemoglobin (Hb A1C) (Completed)   POCT Urinalysis Dipstick (Completed)   CBC with Differential   COMPLETE METABOLIC PANEL WITH GFR   POCT UA - Microalbumin (Completed)   Dyslipidemia associated with type 2 diabetes mellitus (HCC)    Stable and well controlled per last labs in 05/2019, new labs ordered today.  Currently taking simvastatin 40mg  daily and tolerating it well.   Plan: 1) Labs ordered today 2) Continue simvastatin 40mg  daily  3) Heart healthy diet and to exercise every other day for 30 minutes per day, going no more than 2 days in a row without exercise. 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  Takes cyclobenzaprine as needed for muscle spasms near neck.  Plan: 1. Continue cyclobenzaprine 5mg  up to 3x prn daily for neck spasms/pain      Relevant Medications   cyclobenzaprine (FLEXERIL) 5 MG tablet   Rib pain on right side    No known injury/fall,  with right rib pain that is tender to palpation.  Patient denies difficulty breathing, SOB, interference with sleep patterns.  Requesting x-ray.  Plan: 1. Xray right ribs today      Relevant Orders   DG Ribs Unilateral Right    Other Visit Diagnoses    Long-term use of high-risk medication       Relevant Orders   Thyroid Panel With TSH   Encounter for hepatitis C screening test for low risk patient       Neuropathic pain          Meds ordered this encounter  Medications  .  cyclobenzaprine (FLEXERIL) 5 MG tablet    Sig: Take 1 tablet (5 mg total) by mouth 3 (three) times daily as needed for muscle spasms.    Dispense:  30 tablet    Refill:  1  . esomeprazole (NEXIUM) 40 MG capsule    Sig: TAKE 1 CAPSULE DAILY BEFORE BREAKFAST    Dispense:  90 capsule    Refill:  3  . insulin glargine (LANTUS) 100 UNIT/ML injection    Sig: Inject 0.35 mLs (35 Units total) into the skin 2 (two) times daily.    Dispense:  10 mL    Refill:  1  . empagliflozin (JARDIANCE) 10 MG TABS tablet    Sig: Take 10 mg by mouth daily before breakfast.    Dispense:  90 tablet    Refill:  1  . metFORMIN (GLUCOPHAGE-XR) 500 MG 24 hr tablet    Sig: TAKE 2 TABLETS(1000 MG) BY MOUTH TWICE DAILY    Dispense:  360 tablet    Refill:  1    **Patient requests 90 days supply**  . simvastatin (ZOCOR) 40 MG tablet    Sig: Take 1 tablet (40 mg total) by mouth daily.    Dispense:  90 tablet    Refill:  3  . sitaGLIPtin (JANUVIA) 100 MG tablet    Sig: Take 1 tablet (100 mg total) by mouth daily.    Dispense:  90 tablet    Refill:  2      Follow up plan: Return in about 3 months (around 05/26/2020) for DM, A1C follow up.   Charlaine Dalton, FNP Family Nurse Practitioner [facility] 02/24/2020, 9:58 AM

## 2020-03-02 ENCOUNTER — Encounter: Payer: Self-pay | Admitting: Family Medicine

## 2020-04-27 ENCOUNTER — Telehealth: Payer: Self-pay

## 2020-04-27 ENCOUNTER — Ambulatory Visit: Payer: Medicare Other

## 2020-04-27 NOTE — Telephone Encounter (Signed)
This nurse attempted to call patient in regards to our scheduled AWV appointment. Calls attempted at 10:15, 10:20 and 10:30. Message left for patient to call in order to reschedule.

## 2020-05-18 ENCOUNTER — Encounter: Payer: Self-pay | Admitting: Family Medicine

## 2020-08-02 ENCOUNTER — Telehealth: Payer: Self-pay | Admitting: Family Medicine

## 2020-08-02 ENCOUNTER — Ambulatory Visit (INDEPENDENT_AMBULATORY_CARE_PROVIDER_SITE_OTHER): Payer: Medicare Other | Admitting: Family Medicine

## 2020-08-02 ENCOUNTER — Other Ambulatory Visit: Payer: Self-pay

## 2020-08-02 ENCOUNTER — Encounter: Payer: Self-pay | Admitting: Family Medicine

## 2020-08-02 VITALS — BP 87/58 | HR 75 | Temp 98.6°F | Resp 20 | Ht 69.0 in | Wt 140.6 lb

## 2020-08-02 DIAGNOSIS — G629 Polyneuropathy, unspecified: Secondary | ICD-10-CM | POA: Insufficient documentation

## 2020-08-02 MED ORDER — AMITRIPTYLINE HCL 50 MG PO TABS: 50.0000 mg | ORAL_TABLET | Freq: Every day | ORAL | 1 refills | Status: AC

## 2020-08-02 MED ORDER — AMITRIPTYLINE HCL 150 MG PO TABS: 150.0000 mg | ORAL_TABLET | Freq: Every day | ORAL | 1 refills | Status: DC

## 2020-08-02 MED ORDER — AMITRIPTYLINE HCL 150 MG PO TABS: 150.0000 mg | ORAL_TABLET | Freq: Every day | ORAL | 0 refills | Status: DC

## 2020-08-02 MED ORDER — AMITRIPTYLINE HCL 50 MG PO TABS: 50.0000 mg | ORAL_TABLET | Freq: Every day | ORAL | 0 refills | Status: DC

## 2020-08-02 NOTE — Assessment & Plan Note (Signed)
Appears as neuropathic pain, likely secondary to herpes zoster infection that has resolved.  No vesicles, streaking, rashes on skin at time of exam.  Discussed starting low dose gabapentin vs amitriptyline.  Reports has taken gabapentin in the past and was unable to tolerate this.  Interested in trial of amitriptyline for neuropathic pain.  Plan: 1. Begin amitriptyline 50mg  at bedtime daily 2. RTC if symptoms worsen or fail to improve

## 2020-08-02 NOTE — Progress Notes (Signed)
Subjective:    Patient ID: Raymond Huff, male    DOB: Jul 06, 1951, 69 y.o.   MRN: [id number]  Raymond Huff is a 69 y.o. male presenting on 08/02/2020 for armpit (intermittent Rt armpit pain x . Very sensitive to the touch. He denies any rash or redness.)   HPI  Mr. Carswell presents to clinic for concerns of right armpit pain x 2 months.  Reports is very sensitive to the touch and worsened when clothing/fabric lays on top of it.  Denies any history of rash or redness at this time.  Denies any injury, full ROM of shoulders, neck.  Denies any known even immediately prior to onset of discomfort.  Has not tried anything for his symptoms.  Depression screen [facility] 2/9 02/04/2020 12/09/2019 02/27/2019  Decreased Interest 0 0 0  Down, Depressed, Hopeless 0 0 0  PHQ - 2 Score 0 0 0    Social History   Tobacco Use  . Smoking status: Current Every Day Smoker    Packs/day: 0.50    Years: 48.00    Pack years: 24.00    Types: Cigarettes    Last attempt to quit: 02/21/2019    Years since quitting: 1.4  . Smokeless tobacco: Never Used  . Tobacco comment: has quit x 2 for 3.5 years each  Vaping Use  . Vaping Use: Never used  Substance Use Topics  . Alcohol use: Yes    Comment: occasionally  . Drug use: Never    Review of Systems  Constitutional: Negative.   HENT: Negative.   Eyes: Negative.   Respiratory: Negative.   Cardiovascular: Negative.   Gastrointestinal: Negative.   Endocrine: Negative.   Genitourinary: Negative.   Musculoskeletal: Negative.   Skin: Negative.   Allergic/Immunologic: Negative.   Neurological: Negative.        Nerve pain under right axilla  Hematological: Negative.   Psychiatric/Behavioral: Negative.    Per HPI unless specifically indicated above     Objective:    BP (!) 87/58 (BP Location: Right Arm, Patient Position: Sitting, Cuff Size: Normal)   Pulse 75   Temp 98.6 F (37 C) (Oral)   Resp 20   Ht 5\' 9"  (1.753 m)   Wt 140 lb 9.6 oz (63.8 kg)   SpO2  94%   BMI 20.76 kg/m   Wt Readings from Last 3 Encounters:  08/02/20 140 lb 9.6 oz (63.8 kg)  02/24/20 146 lb (66.2 kg)  02/04/20 143 lb (64.9 kg)    Physical Exam Vitals and nursing note reviewed.  Constitutional:      General: He is not in acute distress.    Appearance: Normal appearance. He is well-developed, well-groomed and normal weight. He is not ill-appearing or toxic-appearing.  HENT:     Head: Normocephalic and atraumatic.     Nose:     Comments: 02/06/20 is in place, covering mouth and nose. Eyes:     General:        Right eye: No discharge.        Left eye: No discharge.     Extraocular Movements: Extraocular movements intact.     Conjunctiva/sclera: Conjunctivae normal.     Pupils: Pupils are equal, round, and reactive to light.  Cardiovascular:     Pulses: Normal pulses.  Pulmonary:     Effort: Pulmonary effort is normal. No respiratory distress.  Musculoskeletal:     Right lower leg: No edema.     Left lower leg: No edema.  Skin:  General: Skin is warm and dry.     Capillary Refill: Capillary refill takes less than 2 seconds.     Findings: No erythema, lesion or rash.       Neurological:     General: No focal deficit present.     Mental Status: He is alert and oriented to person, place, and time.  Psychiatric:        Attention and Perception: Attention and perception normal.        Mood and Affect: Mood and affect normal.        Speech: Speech normal.        Behavior: Behavior normal. Behavior is cooperative.        Thought Content: Thought content normal.        Cognition and Memory: Cognition and memory normal.    Results for orders placed or performed in visit on 03/02/20  HM DIABETES EYE EXAM  Result Value Ref Range   HM Diabetic Eye Exam No Retinopathy No Retinopathy      Assessment & Plan:   Problem List Items Addressed This Visit      Nervous and Auditory   Neuropathy - Primary    Appears as neuropathic pain, likely secondary to  herpes zoster infection that has resolved.  No vesicles, streaking, rashes on skin at time of exam.  Discussed starting low dose gabapentin vs amitriptyline.  Reports has taken gabapentin in the past and was unable to tolerate this.  Interested in trial of amitriptyline for neuropathic pain.  Plan: 1. Begin amitriptyline 50mg  at bedtime daily 2. RTC if symptoms worsen or fail to improve      Relevant Medications   amitriptyline (ELAVIL) 50 MG tablet      Meds ordered this encounter  Medications  . DISCONTD: amitriptyline (ELAVIL) 150 MG tablet    Sig: Take 1 tablet (150 mg total) by mouth at bedtime.    Dispense:  30 tablet    Refill:  0  . DISCONTD: amitriptyline (ELAVIL) 150 MG tablet    Sig: Take 1 tablet (150 mg total) by mouth at bedtime.    Dispense:  90 tablet    Refill:  1  . DISCONTD: amitriptyline (ELAVIL) 50 MG tablet    Sig: Take 1 tablet (50 mg total) by mouth at bedtime.    Dispense:  30 tablet    Refill:  0    To replace 150mg  dosing  . amitriptyline (ELAVIL) 50 MG tablet    Sig: Take 1 tablet (50 mg total) by mouth at bedtime.    Dispense:  90 tablet    Refill:  1    To replace 150mg  dosing   Follow up plan: Return if symptoms worsen or fail to improve.   , FNP Family Nurse Practitioner [facility] 08/02/2020, 1:14 PM

## 2020-08-02 NOTE — Telephone Encounter (Signed)
Call to pharmacy to notify of dose change, originally ordered 150mg , intended to order 50mg .  Pharmacy team confirmed and reports will disregard 150mg  order and fill 50mg  dose.

## 2020-08-02 NOTE — Patient Instructions (Signed)
I have sent in a prescription for Amitriptyline 150mg , to take 1 tablet at bedtime.  We will plan to see you back if your symptoms worsen or fail to improve  You will receive a survey after today's visit either digitally by e-mail or paper by USPS mail. Your experiences and feedback matter to .  Please respond so we know how we are doing as we provide care for you.  Call us with any questions/concerns/needs.  It is my goal to be available to you for your health concerns.  Thanks for choosing me to be a partner in your healthcare needs!  [place], FNP-C Family Nurse Practitioner [facility] Phone: [phone]

## 2020-08-04 ENCOUNTER — Telehealth: Payer: Self-pay | Admitting: Family Medicine

## 2020-08-04 NOTE — Telephone Encounter (Signed)
Patient would like the nurse or doctor to call him regarding the medication that the doctor prescribed.  He stated that the pharmacy did not have all the meds.  Please advise and call patient to discuss at [phone]

## 2020-08-05 NOTE — Telephone Encounter (Signed)
Patient called back and is not sure what medication it is.  He stated that the doctor had mentioned a sath or cream to put on the muscle. Please call patient to discuss further.

## 2020-08-05 NOTE — Telephone Encounter (Signed)
I attempted to contact the patient back, no answer. LMOM to return my call. Please find out what medication the patient is requesting when he calls back.

## 2020-08-05 NOTE — Telephone Encounter (Signed)
The pt called requesting a medication that was discussed in the office for his pain. He said it was a cream or ointment to put on his muscles. Please advise

## 2020-08-05 NOTE — Telephone Encounter (Signed)
We discussed Vagisil cream.  It has benzocaine in the cream, which can help numb the pain when applied topically to a shingles discomfort on the skin.

## 2020-08-05 NOTE — Telephone Encounter (Signed)
The pt was notified of the results. He verbalize understanding, no questions or concerns.

## 2020-08-18 ENCOUNTER — Other Ambulatory Visit: Payer: Self-pay | Admitting: Nurse Practitioner

## 2020-08-18 NOTE — Telephone Encounter (Signed)
Requested Prescriptions  Pending Prescriptions Disp Refills  . metoprolol tartrate (LOPRESSOR) 25 MG tablet [Pharmacy Med Name: METOPROLOL TARTRATE TABS 25MG ] 90 tablet 3    Sig: TAKE 1 TABLET DAILY     Cardiovascular:  Beta Blockers Failed - 08/18/2020  3:20 AM      Failed - Last BP in normal range    BP Readings from Last 1 Encounters:  08/02/20 (!) 87/58         Passed - Last Heart Rate in normal range    Pulse Readings from Last 1 Encounters:  08/02/20 75         Passed - Valid encounter within last 6 months    Recent Outpatient Visits          2 weeks ago Neuropathy   [facility] [place], [place], FNP   5 months ago Uncontrolled type 2 diabetes mellitus with hyperglycemia [facility])   [facility], [facility], FNP   6 months ago Rib pain on right side   [facility] [place], [place], DO   8 months ago MRSA exposure   [facility] [place], [place], DO   1 year ago Uncontrolled type 2 diabetes mellitus with hyperglycemia [facility])   [facility], [place], NP

## 2020-09-21 ENCOUNTER — Other Ambulatory Visit: Payer: Self-pay

## 2020-09-21 ENCOUNTER — Encounter: Payer: Self-pay | Admitting: Family Medicine

## 2020-09-21 ENCOUNTER — Ambulatory Visit
Admission: RE | Admit: 2020-09-21 | Discharge: 2020-09-21 | Disposition: A | Discharge status: Home / Self Care | Payer: Medicare Other | Source: Ambulatory Visit | Attending: Family Medicine | Admitting: Family Medicine

## 2020-09-21 ENCOUNTER — Ambulatory Visit (INDEPENDENT_AMBULATORY_CARE_PROVIDER_SITE_OTHER): Payer: Medicare Other | Admitting: Family Medicine

## 2020-09-21 ENCOUNTER — Ambulatory Visit
Admission: RE | Admit: 2020-09-21 | Discharge: 2020-09-21 | Disposition: A | Discharge status: Home / Self Care | Payer: Medicare Other | Attending: Family Medicine | Admitting: Family Medicine

## 2020-09-21 VITALS — BP 109/64 | HR 79 | Temp 97.8°F | Resp 20 | Ht 69.0 in | Wt 151.0 lb

## 2020-09-21 DIAGNOSIS — J410 Simple chronic bronchitis: Secondary | ICD-10-CM

## 2020-09-21 DIAGNOSIS — R0600 Dyspnea, unspecified: Secondary | ICD-10-CM | POA: Diagnosis not present

## 2020-09-21 DIAGNOSIS — M7989 Other specified soft tissue disorders: Secondary | ICD-10-CM | POA: Insufficient documentation

## 2020-09-21 DIAGNOSIS — R0609 Other forms of dyspnea: Secondary | ICD-10-CM

## 2020-09-21 NOTE — Assessment & Plan Note (Signed)
Reports increased SOB with colder weather.  Feels is otherwise well controlled and follows with Pulmonary at [facility] currently.Marland Kitchen

## 2020-09-21 NOTE — Progress Notes (Signed)
Subjective:    Patient ID: Raymond Huff, male    DOB: [birth year], 69 y.o.   MRN: [id number]  Raymond Huff is a 69 y.o. male presenting on 09/21/2020 for Leg Swelling (bilateral leg and feet swelling x 1 week. Pt state he has notice improvement since he started back on his Lasix. )   HPI  [name] presents to clinic with concerns of bilateral lower extremity swelling x 1 week with some DOE and inability to lay flat on his bed.  Reports that he has an old prescription of lasix that he is unsure of when or by whom prescribed, but has started to take this with mild improvement in his lower extremity edema.  Reports all symptoms started approximately 1 week ago, denies dyspnea at rest.  Has a cardiology provider, Dr. Allena Katz with [facility], that he has followed with in the past and last echocardiogram is noted as being in 2020.  Depression screen [facility] 2/9 02/04/2020 12/09/2019 02/27/2019  Decreased Interest 0 0 0  Down, Depressed, Hopeless 0 0 0  PHQ - 2 Score 0 0 0    Social History   Tobacco Use  . Smoking status: Current Every Day Smoker    Packs/day: 0.25    Years: 48.00    Pack years: 12.00    Types: Cigarettes    Last attempt to quit: 02/21/2019    Years since quitting: 1.5  . Smokeless tobacco: Never Used  . Tobacco comment: has quit x 2 for 3.5 years each  Vaping Use  . Vaping Use: Never used  Substance Use Topics  . Alcohol use: Yes    Comment: occasionally  . Drug use: Never    Review of Systems  Constitutional: Negative.   HENT: Negative.   Eyes: Negative.   Respiratory: Positive for shortness of breath. Negative for apnea, cough, choking, chest tightness, wheezing and stridor.        Unable to lay flat due to SOB, has DOE  Cardiovascular: Positive for leg swelling. Negative for chest pain and palpitations.  Gastrointestinal: Negative.   Endocrine: Negative.   Genitourinary: Negative.   Musculoskeletal: Negative.   Skin: Negative.   Allergic/Immunologic: Negative.     Neurological: Negative.   Hematological: Negative.   Psychiatric/Behavioral: Negative.    Per HPI unless specifically indicated above     Objective:    BP 109/64 (BP Location: Right Arm, Patient Position: Sitting, Cuff Size: Normal)   Pulse 79   Temp 97.8 F (36.6 C) (Oral)   Resp 20   Ht 5\' 9"  (1.753 m)   Wt 151 lb (68.5 kg)   SpO2 91%   BMI 22.30 kg/m   Wt Readings from Last 3 Encounters:  09/21/20 151 lb (68.5 kg)  08/02/20 140 lb 9.6 oz (63.8 kg)  02/24/20 146 lb (66.2 kg)    Physical Exam Vitals and nursing note reviewed.  Constitutional:      General: He is not in acute distress.    Appearance: Normal appearance. He is well-developed, well-groomed and normal weight. He is not ill-appearing or toxic-appearing.  HENT:     Head: Normocephalic and atraumatic.     Nose:     Comments: 04/25/20 is in place, covering mouth and nose. Eyes:     General:        Right eye: No discharge.        Left eye: No discharge.     Extraocular Movements: Extraocular movements intact.     Conjunctiva/sclera: Conjunctivae normal.  Pupils: Pupils are equal, round, and reactive to light.  Cardiovascular:     Rate and Rhythm: Normal rate and regular rhythm.     Pulses: Normal pulses.          Dorsalis pedis pulses are 2+ on the right side and 2+ on the left side.     Heart sounds: Normal heart sounds. No murmur heard.  No friction rub. No gallop.      Comments: BLE edema +2, pitting, extending to knee Pulmonary:     Effort: Pulmonary effort is normal. No respiratory distress.     Breath sounds: Normal breath sounds.  Musculoskeletal:     Right lower leg: 2+ Pitting Edema present.     Left lower leg: 2+ Pitting Edema present.  Skin:    General: Skin is warm and dry.     Capillary Refill: Capillary refill takes less than 2 seconds.  Neurological:     General: No focal deficit present.     Mental Status: He is alert and oriented to person, place, and time.  Psychiatric:         Attention and Perception: Attention and perception normal.        Mood and Affect: Mood and affect normal.        Speech: Speech normal.        Behavior: Behavior normal. Behavior is cooperative.        Thought Content: Thought content normal.        Cognition and Memory: Cognition and memory normal.    Results for orders placed or performed in visit on 03/02/20  HM DIABETES EYE EXAM  Result Value Ref Range   HM Diabetic Eye Exam No Retinopathy No Retinopathy      Assessment & Plan:   Problem List Items Addressed This Visit      Respiratory   Simple chronic bronchitis (HCC)    Reports increased SOB with colder weather.  Feels is otherwise well controlled and follows with Pulmonary at [facility] currently..        Other   DOE (dyspnea on exertion) - Primary    Increased DOE in the past week with +2 pitting BLE edema and unable to lay flat at night.  States he has restarted an old lasix prescription, that he is unsure when/whom prescribed.  Encouraged to not start old medications without consulting with his specialist first.  Will have CXR and labs drawn today.  Discussed, is important that she contact his cardiology provider, Dr. Allena Katz, with [facility] and to be seen for concerns of possible CHF/fluid retention.  Patient verbalized understanding and will contact office to schedule f/u appt today.      Relevant Orders   CBC with Differential   COMPLETE METABOLIC PANEL WITH GFR   Leg swelling    See DOE AP      Relevant Orders   DG Chest 2 View   CBC with Differential   COMPLETE METABOLIC PANEL WITH GFR      No orders of the defined types were placed in this encounter.   Follow up plan: Return in about 4 weeks (around 10/19/2020) for After cardiology appt.   Charlaine Dalton, FNP Family Nurse Practitioner [facility] 09/21/2020, 2:57 PM

## 2020-09-21 NOTE — Patient Instructions (Signed)
We have sent your blood work to the lab for processing and will contact you when we receive the results.  Once we have an over-read from the radiology department, we will contact you with the results of your chest xray.  It is encouraged that you contact your cardiology provider, Dr. Allena Katz, with [facility], to schedule a follow up visit for your new lower leg swelling and increased shortness of breath with exertion.  It is possible that they will want to do more cardiac testing.  We will plan to see you back after your visit with Cardiology  You will receive a survey after today's visit either digitally by e-mail or paper by USPS mail. Your experiences and feedback matter to [place].  Please respond so we know how we are doing as we provide care for you.  Call us with any questions/concerns/needs.  It is my goal to be available to you for your health concerns.  Thanks for choosing me to be a partner in your healthcare needs!  Charlaine Dalton, FNP-C Family Nurse Practitioner [facility] Phone: [phone]

## 2020-09-21 NOTE — Assessment & Plan Note (Signed)
See DOE AP

## 2020-09-21 NOTE — Assessment & Plan Note (Addendum)
Increased DOE in the past week with +2 pitting BLE edema and unable to lay flat at night.  States he has restarted an old lasix prescription, that he is unsure when/whom prescribed.  Encouraged to not start old medications without consulting with his specialist first.  Will have CXR and labs drawn today.  Discussed, is important that she contact his cardiology provider, Dr. Allena Katz, with [facility] and to be seen for concerns of possible CHF/fluid retention.  Patient verbalized understanding and will contact office to schedule f/u appt today.

## 2020-09-22 ENCOUNTER — Other Ambulatory Visit: Payer: Self-pay | Admitting: Family Medicine

## 2020-09-22 DIAGNOSIS — J441 Chronic obstructive pulmonary disease with (acute) exacerbation: Secondary | ICD-10-CM

## 2020-09-22 LAB — CBC WITH DIFFERENTIAL/PLATELET
Absolute Monocytes: 744 cells/uL (ref 200–950)
Basophils Absolute: 112 cells/uL (ref 0–200)
Basophils Relative: 0.9 %
Eosinophils Absolute: 174 cells/uL (ref 15–500)
Eosinophils Relative: 1.4 %
HCT: 50.9 % — ABNORMAL HIGH (ref 38.5–50.0)
Hemoglobin: 17.1 g/dL (ref 13.2–17.1)
Lymphs Abs: 3088 cells/uL (ref 850–3900)
MCH: 32.1 pg (ref 27.0–33.0)
MCHC: 33.6 g/dL (ref 32.0–36.0)
MCV: 95.5 fL (ref 80.0–100.0)
MPV: 9.9 fL (ref 7.5–12.5)
Monocytes Relative: 6 %
Neutro Abs: 8283 cells/uL — ABNORMAL HIGH (ref 1500–7800)
Neutrophils Relative %: 66.8 %
Platelets: 292 10*3/uL (ref 140–400)
RBC: 5.33 10*6/uL (ref 4.20–5.80)
RDW: 12.4 % (ref 11.0–15.0)
Total Lymphocyte: 24.9 %
WBC: 12.4 10*3/uL — ABNORMAL HIGH (ref 3.8–10.8)

## 2020-09-22 LAB — COMPLETE METABOLIC PANEL WITH GFR
AG Ratio: 1.4 (calc) (ref 1.0–2.5)
ALT: 14 U/L (ref 9–46)
AST: 11 U/L (ref 10–35)
Albumin: 3.7 g/dL (ref 3.6–5.1)
Alkaline phosphatase (APISO): 104 U/L (ref 35–144)
BUN: 18 mg/dL (ref 7–25)
CO2: 25 mmol/L (ref 20–32)
Calcium: 9.1 mg/dL (ref 8.6–10.3)
Chloride: 106 mmol/L (ref 98–110)
Creat: 1.09 mg/dL (ref 0.70–1.25)
GFR, Est African American: 80 mL/min/{1.73_m2} (ref >=60)
GFR, Est Non African American: 69 mL/min/{1.73_m2} (ref >=60)
Globulin: 2.7 g/dL (calc) (ref 1.9–3.7)
Glucose, Bld: 173 mg/dL — ABNORMAL HIGH (ref 65–99)
Potassium: 4.3 mmol/L (ref 3.5–5.3)
Sodium: 141 mmol/L (ref 135–146)
Total Bilirubin: 0.5 mg/dL (ref 0.2–1.2)
Total Protein: 6.4 g/dL (ref 6.1–8.1)

## 2020-09-22 MED ORDER — ALBUTEROL SULFATE HFA 108 (90 BASE) MCG/ACT IN AERS: 1.0000 | INHALATION_SPRAY | Freq: Four times a day (QID) | RESPIRATORY_TRACT | 1 refills | Status: AC | PRN

## 2020-09-22 MED ORDER — DOXYCYCLINE HYCLATE 100 MG PO TABS: 100.0000 mg | ORAL_TABLET | Freq: Two times a day (BID) | ORAL | 0 refills | Status: DC

## 2020-10-05 ENCOUNTER — Other Ambulatory Visit: Payer: Self-pay | Admitting: Family Medicine

## 2020-10-05 DIAGNOSIS — E1165 Type 2 diabetes mellitus with hyperglycemia: Secondary | ICD-10-CM

## 2020-11-25 NOTE — Telephone Encounter (Signed)
Patient is calling to schedule AWV. CB- [phone]

## 2020-11-29 ENCOUNTER — Other Ambulatory Visit: Payer: Self-pay

## 2020-11-29 DIAGNOSIS — E1165 Type 2 diabetes mellitus with hyperglycemia: Secondary | ICD-10-CM

## 2020-11-29 MED ORDER — METFORMIN HCL ER 500 MG PO TB24: ORAL_TABLET | ORAL | 1 refills | Status: AC

## 2020-11-29 MED ORDER — FREESTYLE LITE TEST VI STRP: ORAL_STRIP | 1 refills | Status: AC

## 2020-11-30 ENCOUNTER — Other Ambulatory Visit: Payer: Self-pay

## 2020-11-30 DIAGNOSIS — E1165 Type 2 diabetes mellitus with hyperglycemia: Secondary | ICD-10-CM

## 2020-11-30 MED ORDER — SITAGLIPTIN PHOSPHATE 100 MG PO TABS: 100.0000 mg | ORAL_TABLET | Freq: Every day | ORAL | 2 refills | Status: AC

## 2020-12-21 ENCOUNTER — Ambulatory Visit (INDEPENDENT_AMBULATORY_CARE_PROVIDER_SITE_OTHER): Payer: Medicare Other

## 2020-12-21 VITALS — Ht 69.5 in | Wt 150.0 lb

## 2020-12-21 DIAGNOSIS — Z0000 Encounter for general adult medical examination without abnormal findings: Secondary | ICD-10-CM

## 2020-12-21 NOTE — Patient Instructions (Signed)
Raymond Huff , Thank you for taking time to come for your Medicare Wellness Visit. I appreciate your ongoing commitment to your health goals. Please review the following plan we discussed and let me know if I can assist you in the future.   Screening recommendations/referrals: Colonoscopy: due Recommended yearly ophthalmology/optometry visit for glaucoma screening and checkup Recommended yearly dental visit for hygiene and checkup  Vaccinations: Influenza vaccine: completed per patient Pneumococcal vaccine: due Tdap vaccine: due Shingles vaccine: discussed   Covid-19:  01/09/2020, 09/24/2020  Advanced directives: Advance directive discussed with you today. Even though you declined this today please call our office should you change your mind and we can give you the proper paperwork for you to fill out.  Conditions/risks identified: smoking  Next appointment: Follow up in one year for your annual wellness visit.   Preventive Care 87 Years and Older, Male Preventive care refers to lifestyle choices and visits with your health care provider that can promote health and wellness. What does preventive care include?  A yearly physical exam. This is also called an annual well check.  Dental exams once or twice a year.  Routine eye exams. Ask your health care provider how often you should have your eyes checked.  Personal lifestyle choices, including:  Daily care of your teeth and gums.  Regular physical activity.  Eating a healthy diet.  Avoiding tobacco and drug use.  Limiting alcohol use.  Practicing safe sex.  Taking low doses of aspirin every day.  Taking vitamin and mineral supplements as recommended by your health care provider. What happens during an annual well check? The services and screenings done by your health care provider during your annual well check will depend on your age, overall health, lifestyle risk factors, and family history of disease. Counseling  Your  health care provider may ask you questions about your:  Alcohol use.  Tobacco use.  Drug use.  Emotional well-being.  Home and relationship well-being.  Sexual activity.  Eating habits.  History of falls.  Memory and ability to understand (cognition).  Work and work Astronomer. Screening  You may have the following tests or measurements:  Height, weight, and BMI.  Blood pressure.  Lipid and cholesterol levels. These may be checked every 5 years, or more frequently if you are over 72 years old.  Skin check.  Lung cancer screening. You may have this screening every year starting at age 79 if you have a 30-pack-year history of smoking and currently smoke or have quit within the past 15 years.  Fecal occult blood test (FOBT) of the stool. You may have this test every year starting at age 55.  Flexible sigmoidoscopy or colonoscopy. You may have a sigmoidoscopy every 5 years or a colonoscopy every 10 years starting at age 29.  Prostate cancer screening. Recommendations will vary depending on your family history and other risks.  Hepatitis C blood test.  Hepatitis B blood test.  Sexually transmitted disease (STD) testing.  Diabetes screening. This is done by checking your blood sugar (glucose) after you have not eaten for a while (fasting). You may have this done every 1-3 years.  Abdominal aortic aneurysm (AAA) screening. You may need this if you are a current or former smoker.  Osteoporosis. You may be screened starting at age 75 if you are at high risk. Talk with your health care provider about your test results, treatment options, and if necessary, the need for more tests. Vaccines  Your health care provider may  recommend certain vaccines, such as:  Influenza vaccine. This is recommended every year.  Tetanus, diphtheria, and acellular pertussis (Tdap, Td) vaccine. You may need a Td booster every 10 years.  Zoster vaccine. You may need this after age  6.  Pneumococcal 13-valent conjugate (PCV13) vaccine. One dose is recommended after age 52.  Pneumococcal polysaccharide (PPSV23) vaccine. One dose is recommended after age 79. Talk to your health care provider about which screenings and vaccines you need and how often you need them. This information is not intended to replace advice given to you by your health care provider. Make sure you discuss any questions you have with your health care provider. Document Released: 11/05/2015 Document Revised: 06/28/2016 Document Reviewed: 08/10/2015 Elsevier Interactive Patient Education  2017 [place] Prevention in the Home Falls can cause injuries. They can happen to people of all ages. There are many things you can do to make your home safe and to help prevent falls. What can I do on the outside of my home?  Regularly fix the edges of walkways and driveways and fix any cracks.  Remove anything that might make you trip as you walk through a door, such as a raised step or threshold.  Trim any bushes or trees on the path to your home.  Use bright outdoor lighting.  Clear any walking paths of anything that might make someone trip, such as rocks or tools.  Regularly check to see if handrails are loose or broken. Make sure that both sides of any steps have handrails.  Any raised decks and porches should have guardrails on the edges.  Have any leaves, snow, or ice cleared regularly.  Use sand or salt on walking paths during winter.  Clean up any spills in your garage right away. This includes oil or grease spills. What can I do in the bathroom?  Use night lights.  Install grab bars by the toilet and in the tub and shower. Do not use towel bars as grab bars.  Use non-skid mats or decals in the tub or shower.  If you need to sit down in the shower, use a plastic, non-slip stool.  Keep the floor dry. Clean up any water that spills on the floor as soon as it happens.  Remove  soap buildup in the tub or shower regularly.  Attach bath mats securely with double-sided non-slip rug tape.  Do not have throw rugs and other things on the floor that can make you trip. What can I do in the bedroom?  Use night lights.  Make sure that you have a light by your bed that is easy to reach.  Do not use any sheets or blankets that are too big for your bed. They should not hang down onto the floor.  Have a firm chair that has side arms. You can use this for support while you get dressed.  Do not have throw rugs and other things on the floor that can make you trip. What can I do in the kitchen?  Clean up any spills right away.  Avoid walking on wet floors.  Keep items that you use a lot in easy-to-reach places.  If you need to reach something above you, use a strong step stool that has a grab bar.  Keep electrical cords out of the way.  Do not use floor polish or wax that makes floors slippery. If you must use wax, use non-skid floor wax.  Do not have throw rugs and  other things on the floor that can make you trip. What can I do with my stairs?  Do not leave any items on the stairs.  Make sure that there are handrails on both sides of the stairs and use them. Fix handrails that are broken or loose. Make sure that handrails are as long as the stairways.  Check any carpeting to make sure that it is firmly attached to the stairs. Fix any carpet that is loose or worn.  Avoid having throw rugs at the top or bottom of the stairs. If you do have throw rugs, attach them to the floor with carpet tape.  Make sure that you have a light switch at the top of the stairs and the bottom of the stairs. If you do not have them, ask someone to add them for you. What else can I do to help prevent falls?  Wear shoes that:  Do not have high heels.  Have rubber bottoms.  Are comfortable and fit you well.  Are closed at the toe. Do not wear sandals.  If you use a  stepladder:  Make sure that it is fully opened. Do not climb a closed stepladder.  Make sure that both sides of the stepladder are locked into place.  Ask someone to hold it for you, if possible.  Clearly mark and make sure that you can see:  Any grab bars or handrails.  First and last steps.  Where the edge of each step is.  Use tools that help you move around (mobility aids) if they are needed. These include:  Canes.  Walkers.  Scooters.  Crutches.  Turn on the lights when you go into a dark area. Replace any light bulbs as soon as they burn out.  Set up your furniture so you have a clear path. Avoid moving your furniture around.  If any of your floors are uneven, fix them.  If there are any pets around you, be aware of where they are.  Review your medicines with your doctor. Some medicines can make you feel dizzy. This can increase your chance of falling. Ask your doctor what other things that you can do to help prevent falls. This information is not intended to replace advice given to you by your health care provider. Make sure you discuss any questions you have with your health care provider. Document Released: 08/05/2009 Document Revised: 03/16/2016 Document Reviewed: 11/13/2014 Elsevier Interactive Patient Education  2017 [employer].

## 2020-12-21 NOTE — Progress Notes (Signed)
I connected with Raymond Huff today by telephone and verified that I am speaking with the correct person using two identifiers. Location patient: home Location provider: work Persons participating in the virtual visit: Lyndall Windt, Elisha Ponder LPN.   I discussed the limitations, risks, security and privacy concerns of performing an evaluation and management service by telephone and the availability of in person appointments. I also discussed with the patient that there may be a patient responsible charge related to this service. The patient expressed understanding and verbally consented to this telephonic visit.    Interactive audio and video telecommunications were attempted between this provider and patient, however failed, due to patient having technical difficulties OR patient did not have access to video capability.  We continued and completed visit with audio only.     Vital signs may be patient reported or missing.  Subjective:   Raymond Huff is a 70 y.o. male who presents for an Initial Medicare Annual Wellness Visit.  Review of Systems     Cardiac Risk Factors include: advanced age (>31men, >67 women);diabetes mellitus;dyslipidemia;male gender;smoking/ tobacco exposure     Objective:    Today's Vitals   12/21/20 1017  Weight: 150 lb (68 kg)  Height: 5' 9.5" (1.765 m)   Body mass index is 21.83 kg/m.  Advanced Directives 12/21/2020  Does Patient Have a Medical Advance Directive? No    Current Medications (verified) Outpatient Encounter Medications as of 12/21/2020  Medication Sig  . albuterol (PROAIR HFA) 108 (90 Base) MCG/ACT inhaler Inhale 1-2 puffs into the lungs every 6 (six) hours as needed for wheezing or shortness of breath.  Marland Kitchen amitriptyline (ELAVIL) 50 MG tablet Take 1 tablet (50 mg total) by mouth at bedtime.  . cyclobenzaprine (FLEXERIL) 5 MG tablet Take 1 tablet (5 mg total) by mouth 3 (three) times daily as needed for muscle spasms.  Marland Kitchen esomeprazole (NEXIUM) 40  MG capsule TAKE 1 CAPSULE DAILY BEFORE BREAKFAST  . Fluticasone-Umeclidin-Vilant 100-62.5-25 MCG/INH AEPB Inhale into the lungs.  Marland Kitchen glucose blood (FREESTYLE LITE) test strip Use as instructed  . insulin glargine (LANTUS) 100 UNIT/ML injection Inject 0.35 mLs (35 Units total) into the skin 2 (two) times daily.  Marland Kitchen JARDIANCE 10 MG TABS tablet TAKE 1 TABLET DAILY BEFORE BREAKFAST  . metFORMIN (GLUCOPHAGE-XR) 500 MG 24 hr tablet TAKE 2 TABLETS(1000 MG) BY MOUTH TWICE DAILY  . metoprolol tartrate (LOPRESSOR) 25 MG tablet TAKE 1 TABLET DAILY  . Omega-3 Fatty Acids (FISH OIL) 500 MG CAPS Take 500 mg by mouth 2 (two) times daily.   . Pancrelipase, Lip-Prot-Amyl, 24000-76000 units CPEP Take 5-7 capsules (120,000-168,000 Units total) by mouth with breakfast, with lunch, and with evening meal.  . simvastatin (ZOCOR) 40 MG tablet Take 1 tablet (40 mg total) by mouth daily.  . sitaGLIPtin (JANUVIA) 100 MG tablet Take 1 tablet (100 mg total) by mouth daily.  Marland Kitchen doxycycline (VIBRA-TABS) 100 MG tablet Take 1 tablet (100 mg total) by mouth 2 (two) times daily. (Patient not taking: Reported on 12/21/2020)  . Multiple Vitamin (MULTIVITAMIN) tablet Take 1 tablet by mouth daily.  (Patient not taking: Reported on 12/21/2020)   No facility-administered encounter medications on file as of 12/21/2020.    Allergies (verified) Hydromorphone and Oxycodone   History: Past Medical History:  Diagnosis Date  . Diabetes mellitus without complication (HCC)   . Hyperlipidemia    Past Surgical History:  Procedure Laterality Date  . APPENDECTOMY    . CARPAL TUNNEL RELEASE    . HERNIA  REPAIR     abdominal  . INGUINAL HERNIA REPAIR     Pt reports x 7 in past  . PANCREATICODUODENECTOMY     whipple  . ROTATOR CUFF REPAIR Bilateral   . Vertebrae fusion     4,5,6, and 7    Family History  Problem Relation Age of Onset  . Heart disease Father    Social History   Socioeconomic History  . Marital status: Married    Spouse  name: Not on file  . Number of children: Not on file  . Years of education: Not on file  . Highest education level: High school graduate  Occupational History  . Occupation: retired  Tobacco Use  . Smoking status: Current Every Day Smoker    Packs/day: 0.25    Years: 48.00    Pack years: 12.00    Types: Cigarettes    Last attempt to quit: 02/21/2019    Years since quitting: 1.8  . Smokeless tobacco: Never Used  . Tobacco comment: has quit x 2 for 3.5 years each  Vaping Use  . Vaping Use: Never used  Substance and Sexual Activity  . Alcohol use: Yes    Comment: occasionally  . Drug use: Never  . Sexual activity: Not Currently  Other Topics Concern  . Not on file  Social History Narrative  . Not on file   Social Determinants of Health   Financial Resource Strain: Low Risk   . Difficulty of Paying Living Expenses: Not hard at all  Food Insecurity: No Food Insecurity  . Worried About Programme researcher, broadcasting/film/video in the Last Year: Never true  . Ran Out of Food in the Last Year: Never true  Transportation Needs: No Transportation Needs  . Lack of Transportation (Medical): No  . Lack of Transportation (Non-Medical): No  Physical Activity: Inactive  . Days of Exercise per Week: 0 days  . Minutes of Exercise per Session: 0 min  Stress: No Stress Concern Present  . Feeling of Stress : Not at all  Social Connections: Not on file    Tobacco Counseling Ready to quit: Yes Counseling given: Not Answered Comment: has quit x 2 for 3.5 years each   Clinical Intake:  Pre-visit preparation completed: Yes  Pain : No/denies pain     Nutritional Status: BMI of 19-24  Normal Nutritional Risks: None Diabetes: Yes  How often do you need to have someone help you when you read instructions, pamphlets, or other written materials from your doctor or pharmacy?: 1 - Never What is the last grade level you completed in school?: 12th grade  Diabetic? Yes Nutrition Risk Assessment:  Has the  patient had any N/V/D within the last 2 months?  No  Does the patient have any non-healing wounds?  No  Has the patient had any unintentional weight loss or weight gain?  No   Diabetes:  Is the patient diabetic?  Yes  If diabetic, was a CBG obtained today?  No  Did the patient bring in their glucometer from home?  No  How often do you monitor your CBG's? 4 times daily.   Financial Strains and Diabetes Management:  Are you having any financial strains with the device, your supplies or your medication? No .  Does the patient want to be seen by Chronic Care Management for management of their diabetes?  No  Would the patient like to be referred to a Nutritionist or for Diabetic Management?  No   Diabetic Exams:  Diabetic Eye Exam: Overdue for diabetic eye exam. Pt has been advised about the importance in completing this exam. Patient advised to call and schedule an eye exam. Diabetic Foot Exam: Completed 02/24/2020   Interpreter Needed?: No  Information entered by :: NAllen LPN   Activities of Daily Living In your present state of health, do you have any difficulty performing the following activities: 12/21/2020  Hearing? Y  Vision? N  Difficulty concentrating or making decisions? N  Walking or climbing stairs? Y  Dressing or bathing? N  Doing errands, shopping? N  Preparing Food and eating ? N  Using the Toilet? N  In the past six months, have you accidently leaked urine? N  Do you have problems with loss of bowel control? N  Managing your Medications? N  Managing your Finances? N  Housekeeping or managing your Housekeeping? N  Some recent data might be hidden    Patient Care Team: Malfi, Jodelle GrossNicole M, FNP as PCP - General (Family Medicine)  Indicate any recent Medical Services you may have received from other than [facility] providers in the past year (date may be approximate).     Assessment:   This is a routine wellness examination for Camar.  Hearing/Vision screen No exam  data present  Dietary issues and exercise activities discussed: Current Exercise Habits: The patient does not participate in regular exercise at present  Goals    . Patient Stated     12/21/2020, wants to weigh 165-170 pounds      Depression Screen PHQ 2/9 Scores 12/21/2020 02/04/2020 12/09/2019 02/27/2019  PHQ - 2 Score 0 0 0 0    Fall Risk Fall Risk  12/21/2020 02/04/2020 12/09/2019 06/05/2019 02/27/2019  Falls in the past year? 0 0 0 0 0  Number falls in past yr: - 0 0 0 -  Injury with Fall? - 0 0 - -  Risk for fall due to : Medication side effect - - - -  Follow up Falls evaluation completed;Education provided;Falls prevention discussed Falls evaluation completed Falls evaluation completed - Falls evaluation completed    FALL RISK PREVENTION PERTAINING TO THE HOME:  Any stairs in or around the home? Yes  If so, are there any without handrails? No  Home free of loose throw rugs in walkways, pet beds, electrical cords, etc? Yes  Adequate lighting in your home to reduce risk of falls? Yes   ASSISTIVE DEVICES UTILIZED TO PREVENT FALLS:  Life alert? No  Use of a cane, walker or w/c? No  Grab bars in the bathroom? No  Shower chair or bench in shower? No  Elevated toilet seat or a handicapped toilet? No   TIMED UP AND GO:  Was the test performed? No   Cognitive Function:     6CIT Screen 12/21/2020  What Year? 0 points  What month? 0 points  What time? 0 points  Count back from 20 0 points  Months in reverse 0 points  Repeat phrase 4 points  Total Score 4    Immunizations  There is no immunization history on file for this patient.  TDAP status: Due, Education has been provided regarding the importance of this vaccine. Advised may receive this vaccine at local pharmacy or Health Dept. Aware to provide a copy of the vaccination record if obtained from local pharmacy or Health Dept. Verbalized acceptance and understanding.  Flu Vaccine status: Up to date  Pneumococcal vaccine  status: Due, Education has been provided regarding the importance of this vaccine. Advised  may receive this vaccine at local pharmacy or Health Dept. Aware to provide a copy of the vaccination record if obtained from local pharmacy or Health Dept. Verbalized acceptance and understanding.  Covid-19 vaccine status: Completed vaccines  Qualifies for Shingles Vaccine? Yes   Zostavax completed No   Shingrix Completed?: No.    Education has been provided regarding the importance of this vaccine. Patient has been advised to call insurance company to determine out of pocket expense if they have not yet received this vaccine. Advised may also receive vaccine at local pharmacy or Health Dept. Verbalized acceptance and understanding.  Screening Tests Health Maintenance  Topic Date Due  . Hepatitis C Screening  Never done  . COVID-19 Vaccine (1) Never done  . TETANUS/TDAP  Never done  . COLONOSCOPY (Pts 45-16yrs Insurance coverage will need to be confirmed)  Never done  . PNA vac Low Risk Adult (1 of 2 - PCV13) Never done  . INFLUENZA VACCINE  Never done  . OPHTHALMOLOGY EXAM  06/23/2020  . HEMOGLOBIN A1C  08/26/2020  . FOOT EXAM  02/23/2021  . URINE MICROALBUMIN  02/23/2021  . HPV VACCINES  Aged Out    Health Maintenance  Health Maintenance Due  Topic Date Due  . Hepatitis C Screening  Never done  . COVID-19 Vaccine (1) Never done  . TETANUS/TDAP  Never done  . COLONOSCOPY (Pts 45-28yrs Insurance coverage will need to be confirmed)  Never done  . PNA vac Low Risk Adult (1 of 2 - PCV13) Never done  . INFLUENZA VACCINE  Never done  . OPHTHALMOLOGY EXAM  06/23/2020  . HEMOGLOBIN A1C  08/26/2020    Colorectal cancer screening: due  Lung Cancer Screening: (Low Dose CT Chest recommended if Age 5-80 years, 30 pack-year currently smoking OR have quit w/in 15years.) does not qualify.   Lung Cancer Screening Referral: no  Additional Screening:  Hepatitis C Screening: does qualify;  due  Vision Screening: Recommended annual ophthalmology exams for early detection of glaucoma and other disorders of the eye. Is the patient up to date with their annual eye exam?  No  Who is the provider or what is the name of the office in which the patient attends annual eye exams? [facility] If pt is not established with a provider, would they like to be referred to a provider to establish care? No .   Dental Screening: Recommended annual dental exams for proper oral hygiene  Community Resource Referral / Chronic Care Management: CRR required this visit?  No   CCM required this visit?  No      Plan:     I have personally reviewed and noted the following in the patient's chart:   . Medical and social history . Use of alcohol, tobacco or illicit drugs  . Current medications and supplements . Functional ability and status . Nutritional status . Physical activity . Advanced directives . List of other physicians . Hospitalizations, surgeries, and ER visits in previous 12 months . Vitals . Screenings to include cognitive, depression, and falls . Referrals and appointments  In addition, I have reviewed and discussed with patient certain preventive protocols, quality metrics, and best practice recommendations. A written personalized care plan for preventive services as well as general preventive health recommendations were provided to patient.     Barb Merino, LPN   [id number]   Nurse Notes:

## 2021-01-26 ENCOUNTER — Telehealth (INDEPENDENT_AMBULATORY_CARE_PROVIDER_SITE_OTHER): Payer: Medicare Other | Admitting: Family Medicine

## 2021-01-26 ENCOUNTER — Encounter: Payer: Self-pay | Admitting: Family Medicine

## 2021-01-26 ENCOUNTER — Ambulatory Visit: Payer: Self-pay | Admitting: *Deleted

## 2021-01-26 VITALS — Wt 150.0 lb

## 2021-01-26 DIAGNOSIS — M7989 Other specified soft tissue disorders: Secondary | ICD-10-CM

## 2021-01-26 DIAGNOSIS — J441 Chronic obstructive pulmonary disease with (acute) exacerbation: Secondary | ICD-10-CM | POA: Diagnosis not present

## 2021-01-26 DIAGNOSIS — R0600 Dyspnea, unspecified: Secondary | ICD-10-CM

## 2021-01-26 DIAGNOSIS — R0609 Other forms of dyspnea: Secondary | ICD-10-CM

## 2021-01-26 MED ORDER — PREDNISONE 20 MG PO TABS: ORAL_TABLET | ORAL | 0 refills | Status: AC

## 2021-01-26 MED ORDER — FUROSEMIDE 20 MG PO TABS: 20.0000 mg | ORAL_TABLET | Freq: Every day | ORAL | 3 refills | Status: AC | PRN

## 2021-01-26 MED ORDER — LEVOFLOXACIN 500 MG PO TABS
500.0000 mg | ORAL_TABLET | Freq: Every day | ORAL | 0 refills | Status: AC
Start: 2021-01-26 — End: ?

## 2021-01-26 NOTE — Telephone Encounter (Signed)
Patient c/o increasing shortness of breath and bilateral feet and legs swelling. On oxygen since last year hx COPD, emphysema. O2 sats ranging between 88-91% on 3 L and was increased to 5 L today. Swelling in bilateral feet and toes up to knees. Painful to walk. Took lasix 20 last night and no improvement in swelling in feet and legs. Lightheaded at times. No dizziness. Denies chest pain, fever, difficulty breathing. Reports coughing  Productive and mucus is clear to yellow in color. My Chart virtual visit scheduled for 01/26/21. Called patient and left message of My Chart visit. Patient is requesting office visit instead. Care advise given and patient and family report he has rested and elevated legs with no relief of swelling. Patient and family verbalized understanding of care advise and to call back or go to UC if symptoms worsen but patient reports he will not go to ED and sit for hours to be seen. Please advise if office visit available with any provider and notify patient if My Chart appt is only availability .  Reason for Disposition . [1] Longstanding difficulty breathing (e.g., CHF, COPD, emphysema) AND [2] WORSE than normal  Answer Assessment - Initial Assessment Questions 1. RESPIRATORY STATUS: "Describe your breathing?" (e.g., wheezing, shortness of breath, unable to speak, severe coughing)      Shortness of breath "for a while" coughing 2. ONSET: "When did this breathing problem begin?"      "a while" getting worse and requires increase from 3L of O2 to 5L of oxygen and O2 sats between 88-91% 3. PATTERN "Does the difficult breathing come and go, or has it been constant since it started?"      na 4. SEVERITY: "How bad is your breathing?" (e.g., mild, moderate, severe)    - MILD: No SOB at rest, mild SOB with walking, speaks normally in sentences, can lay down, no retractions, pulse < 100.    - MODERATE: SOB at rest, SOB with minimal exertion and prefers to sit, cannot lie down flat, speaks  in phrases, mild retractions, audible wheezing, pulse 100-120.    - SEVERE: Very SOB at rest, speaks in single words, struggling to breathe, sitting hunched forward, retractions, pulse > 120      Getting worse. 5. RECURRENT SYMPTOM: "Have you had difficulty breathing before?" If Yes, ask: "When was the last time?" and "What happened that time?"      Yes , chronic COPD, emphysema.  6. CARDIAC HISTORY: "Do you have any history of heart disease?" (e.g., heart attack, angina, bypass surgery, angioplasty)      Heart issues  7. LUNG HISTORY: "Do you have any history of lung disease?"  (e.g., pulmonary embolus, asthma, emphysema)     Yes COPD, emphysema 8. CAUSE: "What do you think is causing the breathing problem?"      Bilateral legs swelling and feet  9. OTHER SYMPTOMS: "Do you have any other symptoms? (e.g., dizziness, runny nose, cough, chest pain, fever)     Cough , nasal drainage 10. PREGNANCY: "Is there any chance you are pregnant?" "When was your last menstrual period?"       na 11. TRAVEL: "Have you traveled out of the country in the last month?" (e.g., travel history, exposures)       na  Protocols used: BREATHING DIFFICULTY-A-AH

## 2021-01-26 NOTE — Progress Notes (Addendum)
Virtual Visit via Telephone The purpose of this virtual visit is to provide medical care while limiting exposure to the novel coronavirus (COVID19) for both patient and office staff.  Consent was obtained for phone visit:  Yes.   Answered questions that patient had about telehealth interaction:  Yes.   I discussed the limitations, risks, security and privacy concerns of performing an evaluation and management service by telephone. I also discussed with the patient that there may be a patient responsible charge related to this service. The patient expressed understanding and agreed to proceed.  Patient Location: Home Provider Location: [facility] (Office)  Participants in virtual visit: - Patient: Raymond Huff - Spouse: Nicole Cella - CMA: Burnell Blanks, CMA - Provider: Dr Althea Charon  ---------------------------------------------------------------------- Chief Complaint  Patient presents with  . Shortness of Breath  . Leg Swelling   Previous PCP Danielle Rankin, FNP and Wilhelmina Mcardle, AGPCNP-BC   S: Reviewed CMA documentation. I have called patient and gathered additional HPI as follows:  Acute COPD Exacerbation, Centrilobular Emphysema Followed by Asthma/Pulmonology - last seen 11/2020 - documentation on their chart that he was not using oxygen consistently. next apt 02/2021 On home O2, 3L 88% today, he increased up to 5L at 91%  Worsening dyspnea, leg swelling bilateral  Last apt 10/2020 [facility], he had ECHO 09/2020. He is on fluid medication diuretic and other cardiac meds.  Sinusitis Gradual worsening sinus drainage and productive cough Taking lasix 20mg  x 2 today unsure if helping Taking OTC medication, NyQuil, Theraflu, Robitussin   Denies any known or suspected exposure to person with or possibly with COVID19.  Denies any fevers, chills, sweats, body ache, abdominal pain, diarrhea, chest pain  Past Medical History:  Diagnosis Date  . Diabetes  mellitus without complication (HCC)   . Hyperlipidemia    Social History   Tobacco Use  . Smoking status: Current Every Day Smoker    Packs/day: 0.25    Years: 48.00    Pack years: 12.00    Types: Cigarettes    Last attempt to quit: 02/21/2019    Years since quitting: 1.9  . Smokeless tobacco: Never Used  . Tobacco comment: has quit x 2 for 3.5 years each  Vaping Use  . Vaping Use: Never used  Substance Use Topics  . Alcohol use: Yes    Comment: occasionally  . Drug use: Never    Current Outpatient Medications:  .  albuterol (PROAIR HFA) 108 (90 Base) MCG/ACT inhaler, Inhale 1-2 puffs into the lungs every 6 (six) hours as needed for wheezing or shortness of breath., Disp: 8 g, Rfl: 1 .  amitriptyline (ELAVIL) 50 MG tablet, Take 1 tablet (50 mg total) by mouth at bedtime., Disp: 90 tablet, Rfl: 1 .  cyclobenzaprine (FLEXERIL) 5 MG tablet, Take 1 tablet (5 mg total) by mouth 3 (three) times daily as needed for muscle spasms., Disp: 30 tablet, Rfl: 1 .  esomeprazole (NEXIUM) 40 MG capsule, TAKE 1 CAPSULE DAILY BEFORE BREAKFAST, Disp: 90 capsule, Rfl: 3 .  Fluticasone-Umeclidin-Vilant 100-62.5-25 MCG/INH AEPB, Inhale into the lungs., Disp: , Rfl:  .  furosemide (LASIX) 20 MG tablet, Take 1 tablet (20 mg total) by mouth daily as needed for fluid or edema., Disp: 30 tablet, Rfl: 3 .  glucose blood (FREESTYLE LITE) test strip, Use as instructed, Disp: 100 each, Rfl: 1 .  insulin glargine (LANTUS) 100 UNIT/ML injection, Inject 0.35 mLs (35 Units total) into the skin 2 (two) times daily., Disp: 10  mL, Rfl: 1 .  JARDIANCE 10 MG TABS tablet, TAKE 1 TABLET DAILY BEFORE BREAKFAST, Disp: 90 tablet, Rfl: 3 .  levofloxacin (LEVAQUIN) 500 MG tablet, Take 1 tablet (500 mg total) by mouth daily. For 7 days, Disp: 7 tablet, Rfl: 0 .  metFORMIN (GLUCOPHAGE-XR) 500 MG 24 hr tablet, TAKE 2 TABLETS(1000 MG) BY MOUTH TWICE DAILY, Disp: 360 tablet, Rfl: 1 .  metoprolol tartrate (LOPRESSOR) 25 MG tablet, TAKE  1 TABLET DAILY, Disp: 90 tablet, Rfl: 3 .  Multiple Vitamin (MULTIVITAMIN) tablet, Take 1 tablet by mouth daily., Disp: , Rfl:  .  Omega-3 Fatty Acids (FISH OIL) 500 MG CAPS, Take 500 mg by mouth 2 (two) times daily. , Disp: , Rfl:  .  Pancrelipase, Lip-Prot-Amyl, 24000-76000 units CPEP, Take 5-7 capsules (120,000-168,000 Units total) by mouth with breakfast, with lunch, and with evening meal., Disp: 1890 capsule, Rfl: 1 .  predniSONE (DELTASONE) 20 MG tablet, Take daily with food. Start with 60mg  (3 pills) x 2 days, then reduce to 40mg  (2 pills) x 2 days, then 20mg  (1 pill) x 3 days, Disp: 13 tablet, Rfl: 0 .  simvastatin (ZOCOR) 40 MG tablet, Take 1 tablet (40 mg total) by mouth daily., Disp: 90 tablet, Rfl: 3 .  sitaGLIPtin (JANUVIA) 100 MG tablet, Take 1 tablet (100 mg total) by mouth daily., Disp: 90 tablet, Rfl: 2  Depression screen [facility] 2/9 12/21/2020 02/04/2020 12/09/2019  Decreased Interest 0 0 0  Down, Depressed, Hopeless 0 0 0  PHQ - 2 Score 0 0 0    No flowsheet data found.  -------------------------------------------------------------------------- O: No physical exam performed due to remote telephone encounter.  Lab results reviewed.  No results found for this or any previous visit (from the past 2160 hour(s)).  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 They have not notified Cardiology  I advised that this is challenging to diagnose and treat remotely Triage has already advised hospital ED evaluation. Instead patient opted for virtual visit now add on acute visit.  Based on inc oxygen requirement and cough productive sputum will treat as AECOPD  Start taking Levaquin antibiotic 500mg  daily x 7 days Start Prednisone taper 7 day Use inhalers / Albuterol PRN  Given fluid swelling, may double lasix furosemide 20mg  daily PRN to 40mg  daily for about 1-3 days or can do 20mg  BID PRN for a few days.  He should return to specialists and notify them of scenario  He should continue Oxygen and if higher need OR if problems UNRESOLVED on this treatment he should seek care more immediately at hospital ED acutely. They have verbalized understanding.   Meds ordered this encounter  Medications  . levofloxacin (LEVAQUIN) 500 MG tablet    Sig: Take 1 tablet (500 mg total) by mouth daily. For 7 days  Dispense:  7 tablet    Refill:  0  . predniSONE (DELTASONE) 20 MG tablet    Sig: Take daily with food. Start with 60mg  (3 pills) x 2 days, then reduce to 40mg  (2 pills) x 2 days, then 20mg  (1 pill) x 3 days    Dispense:  13 tablet    Refill:  0  . furosemide (LASIX) 20 MG tablet    Sig: Take 1 tablet (20 mg total) by mouth daily as needed for fluid or edema.    Dispense:  30 tablet    Refill:  3    Follow-up: - Return in 1 week if not improved  Patient verbalizes understanding with the above medical recommendations including the limitation of remote medical advice.  Specific follow-up and call-back criteria were given for patient to follow-up or seek medical care more urgently if needed.   - Time spent in direct consultation with patient on phone: 15 minutes   , DO [facility] 01/26/2021, 3:22 PM

## 2021-03-28 ENCOUNTER — Telehealth: Payer: Self-pay

## 2021-03-28 NOTE — Telephone Encounter (Signed)
Raynelle Fanning, Radiologist from [facility] Radiology department called to report the patient was seen in the ER on 03/08/2021 and had a CT abdomen and pelvis that showed: Marland Kitchen Pancreatic ductal dilatation measuring 6 mm of uncertain chronicity. Recommend comparison with prior imaging if available.  2. 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   She said the patient was mad aware during his ER visit.

## 2021-03-28 NOTE — Telephone Encounter (Signed)
Noted, will discuss at next visit.

## 2021-04-05 ENCOUNTER — Ambulatory Visit: Payer: Medicare Other | Admitting: Internal Medicine

## 2021-04-05 NOTE — Progress Notes (Deleted)
Subjective:    Patient ID: Raymond Huff, male    DOB: 04-22-1951, 70 y.o.   MRN: [id number]  HPI  Patient presents the clinic today for follow-up of chronic conditions.  He is establishing care with me today, transferring care from Malva Cogan, NP.  DM2: His last A1c was 8.5%, 02/2020.  He is taking Metformin, Jardiance, Januvia and Lantus.  His sugars range.  He does not check his feet routinely.  His last eye exam was.  Flu.  Pneumovax.  Prevnar.  COVID-Janssen x1, Moderna x1.  HLD: His last LDL was 59, triglycerides 465, 01/2021.  He denies myalgias on Simvastatin and Fish Oil.  He tries to consume a low-fat diet.  GERD: Triggered by.  He denies breakthrough on Esomeprazole.  There is no upper GI on file.  Pulmonary Artery Hypertension: He is taking metoprolol as prescribed.  There is no echocardiogram on file.  Chronic Low Back Pain: Managed on Amitriptyline and Flexeril.  He is not currently following with orthopedics.  COPD: He reports.  He is taking Trelegy and Albuterol as prescribed.  There are no PFTs on file.  He does not follow with pulmonology.  Peripheral Edema: He takes furosemide as needed with good relief of symptoms.  He also has some concerns about memory loss.  Review of Systems     Past Medical History:  Diagnosis Date   Diabetes mellitus without complication (HCC)    Hyperlipidemia     Current Outpatient Medications  Medication Sig Dispense Refill   albuterol (PROAIR HFA) 108 (90 Base) MCG/ACT inhaler Inhale 1-2 puffs into the lungs every 6 (six) hours as needed for wheezing or shortness of breath. 8 g 1   amitriptyline (ELAVIL) 50 MG tablet Take 1 tablet (50 mg total) by mouth at bedtime. 90 tablet 1   cyclobenzaprine (FLEXERIL) 5 MG tablet Take 1 tablet (5 mg total) by mouth 3 (three) times daily as needed for muscle spasms. 30 tablet 1   esomeprazole (NEXIUM) 40 MG capsule TAKE 1 CAPSULE DAILY BEFORE BREAKFAST 90 capsule 3   Fluticasone-Umeclidin-Vilant  100-62.5-25 MCG/INH AEPB Inhale into the lungs.     furosemide (LASIX) 20 MG tablet Take 1 tablet (20 mg total) by mouth daily as needed for fluid or edema. 30 tablet 3   glucose blood (FREESTYLE LITE) test strip Use as instructed 100 each 1   insulin glargine (LANTUS) 100 UNIT/ML injection Inject 0.35 mLs (35 Units total) into the skin 2 (two) times daily. 10 mL 1   JARDIANCE 10 MG TABS tablet TAKE 1 TABLET DAILY BEFORE BREAKFAST 90 tablet 3   levofloxacin (LEVAQUIN) 500 MG tablet Take 1 tablet (500 mg total) by mouth daily. For 7 days 7 tablet 0   metFORMIN (GLUCOPHAGE-XR) 500 MG 24 hr tablet TAKE 2 TABLETS(1000 MG) BY MOUTH TWICE DAILY 360 tablet 1   metoprolol tartrate (LOPRESSOR) 25 MG tablet TAKE 1 TABLET DAILY 90 tablet 3   Multiple Vitamin (MULTIVITAMIN) tablet Take 1 tablet by mouth daily.     Omega-3 Fatty Acids (FISH OIL) 500 MG CAPS Take 500 mg by mouth 2 (two) times daily.      Pancrelipase, Lip-Prot-Amyl, 24000-76000 units CPEP Take 5-7 capsules (120,000-168,000 Units total) by mouth with breakfast, with lunch, and with evening meal. 1890 capsule 1   predniSONE (DELTASONE) 20 MG tablet Take daily with food. Start with 60mg  (3 pills) x 2 days, then reduce to 40mg  (2 pills) x 2 days, then 20mg  (1 pill) x 3 days  13 tablet 0   simvastatin (ZOCOR) 40 MG tablet Take 1 tablet (40 mg total) by mouth daily. 90 tablet 3   sitaGLIPtin (JANUVIA) 100 MG tablet Take 1 tablet (100 mg total) by mouth daily. 90 tablet 2   No current facility-administered medications for this visit.    Allergies  Allergen Reactions   Hydromorphone Hives   Oxycodone Hives    Family History  Problem Relation Age of Onset   Heart disease Father     Social History   Socioeconomic History   Marital status: Married    Spouse name: Not on file   Number of children: Not on file   Years of education: Not on file   Highest education level: High school graduate  Occupational History   Occupation: retired   Tobacco Use   Smoking status: Every Day    Packs/day: 0.25    Years: 48.00    Pack years: 12.00    Types: Cigarettes    Last attempt to quit: 02/21/2019    Years since quitting: 2.1   Smokeless tobacco: Never   Tobacco comments:    has quit x 2 for 3.5 years each  Vaping Use   Vaping Use: Never used  Substance and Sexual Activity   Alcohol use: Yes    Comment: occasionally   Drug use: Never   Sexual activity: Not Currently  Other Topics Concern   Not on file  Social History Narrative   Not on file   Social Determinants of Health   Financial Resource Strain: Low Risk    Difficulty of Paying Living Expenses: Not hard at all  Food Insecurity: No Food Insecurity   Worried About Programme researcher, broadcasting/film/video in the Last Year: Never true   Ran Out of Food in the Last Year: Never true  Transportation Needs: No Transportation Needs   Lack of Transportation (Medical): No   Lack of Transportation (Non-Medical): No  Physical Activity: Inactive   Days of Exercise per Week: 0 days   Minutes of Exercise per Session: 0 min  Stress: No Stress Concern Present   Feeling of Stress : Not at all  Social Connections: Not on file  Intimate Partner Violence: Not on file     Constitutional: Denies fever, malaise, fatigue, headache or abrupt weight changes.  HEENT: Denies eye pain, eye redness, ear pain, ringing in the ears, wax buildup, runny nose, nasal congestion, bloody nose, or sore throat. Respiratory: Denies difficulty breathing, shortness of breath, cough or sputum production.   Cardiovascular: Denies chest pain, chest tightness, palpitations or swelling in the hands or feet.  Gastrointestinal: Denies abdominal pain, bloating, constipation, diarrhea or blood in the stool.  GU: Denies urgency, frequency, pain with urination, burning sensation, blood in urine, odor or discharge. Musculoskeletal: Patient reports chronic low back pain.  Denies decrease in range of motion, difficulty with gait, or  joint swelling.  Skin: Denies redness, rashes, lesions or ulcercations.  Neurological: Patient reports difficulty with memory.  Denies dizziness, difficulty with speech or problems with balance and coordination.  Psych: Denies anxiety, depression, SI/HI.  No other specific complaints in a complete review of systems (except as listed in HPI above).  Objective:   Physical Exam   There were no vitals taken for this visit. Wt Readings from Last 3 Encounters:  01/26/21 150 lb (68 kg)  12/21/20 150 lb (68 kg)  09/21/20 151 lb (68.5 kg)    General: Appears their stated age, well developed, well nourished in NAD.  Skin: Warm, dry and intact. No rashes, lesions or ulcerations noted. HEENT: Head: normal shape and size; Eyes: sclera white, no icterus, conjunctiva pink, PERRLA and EOMs intact; Ears: Tm's gray and intact, normal light reflex; Nose: mucosa pink and moist, septum midline; Throat/Mouth: Teeth present, mucosa pink and moist, no exudate, lesions or ulcerations noted.  Neck:  Neck supple, trachea midline. No masses, lumps or thyromegaly present.  Cardiovascular: Normal rate and rhythm. S1,S2 noted.  No murmur, rubs or gallops noted. No JVD or BLE edema. No carotid bruits noted. Pulmonary/Chest: Normal effort and positive vesicular breath sounds. No respiratory distress. No wheezes, rales or ronchi noted.  Abdomen: Soft and nontender. Normal bowel sounds. No distention or masses noted. Liver, spleen and kidneys non palpable. Musculoskeletal: Normal range of motion. No signs of joint swelling. No difficulty with gait.  Neurological: Alert and oriented. Cranial nerves II-XII grossly intact. Coordination normal.  Psychiatric: Mood and affect normal. Behavior is normal. Judgment and thought content normal.   EKG:  BMET    Component Value Date/Time   NA 141 09/21/2020 1351   K 4.3 09/21/2020 1351   CL 106 09/21/2020 1351   CO2 25 09/21/2020 1351   GLUCOSE 173 (H) 09/21/2020 1351   BUN  18 09/21/2020 1351   CREATININE 1.09 09/21/2020 1351   CALCIUM 9.1 09/21/2020 1351   GFRNONAA 69 09/21/2020 1351   GFRAA 80 09/21/2020 1351    Lipid Panel     Component Value Date/Time   CHOL 161 06/04/2019 0923   TRIG 196 (H) 06/04/2019 0923   HDL 49 06/04/2019 0923   CHOLHDL 3.3 06/04/2019 0923   LDLCALC 83 06/04/2019 0923    CBC    Component Value Date/Time   WBC 12.4 (H) 09/21/2020 1351   RBC 5.33 09/21/2020 1351   HGB 17.1 09/21/2020 1351   HCT 50.9 (H) 09/21/2020 1351   PLT 292 09/21/2020 1351   MCV 95.5 09/21/2020 1351   MCH 32.1 09/21/2020 1351   MCHC 33.6 09/21/2020 1351   RDW 12.4 09/21/2020 1351   LYMPHSABS 3,088 09/21/2020 1351   EOSABS 174 09/21/2020 1351   BASOSABS 112 09/21/2020 1351    Hgb A1C Lab Results  Component Value Date   HGBA1C 8.5 (A) 02/24/2020           Assessment & Plan:     Raymond Reaper, NP This visit occurred during the SARS-CoV-2 public health emergency.  Safety protocols were in place, including screening questions prior to the visit, additional usage of staff PPE, and extensive cleaning of exam room while observing appropriate contact time as indicated for disinfecting solutions.

## 2021-04-06 ENCOUNTER — Ambulatory Visit: Payer: Medicare Other | Admitting: Internal Medicine

## 2021-04-07 IMAGING — DX DG RIBS 2V*R*
4 series · 4 of 4 positions shown · non-contrast
Comparison: None.

CLINICAL DATA: Right rib pain with palpation. Pain for 2 months, no
known injury.

EXAM:
RIGHT RIBS - 2 VIEW

[rib pa (1 of 2)]
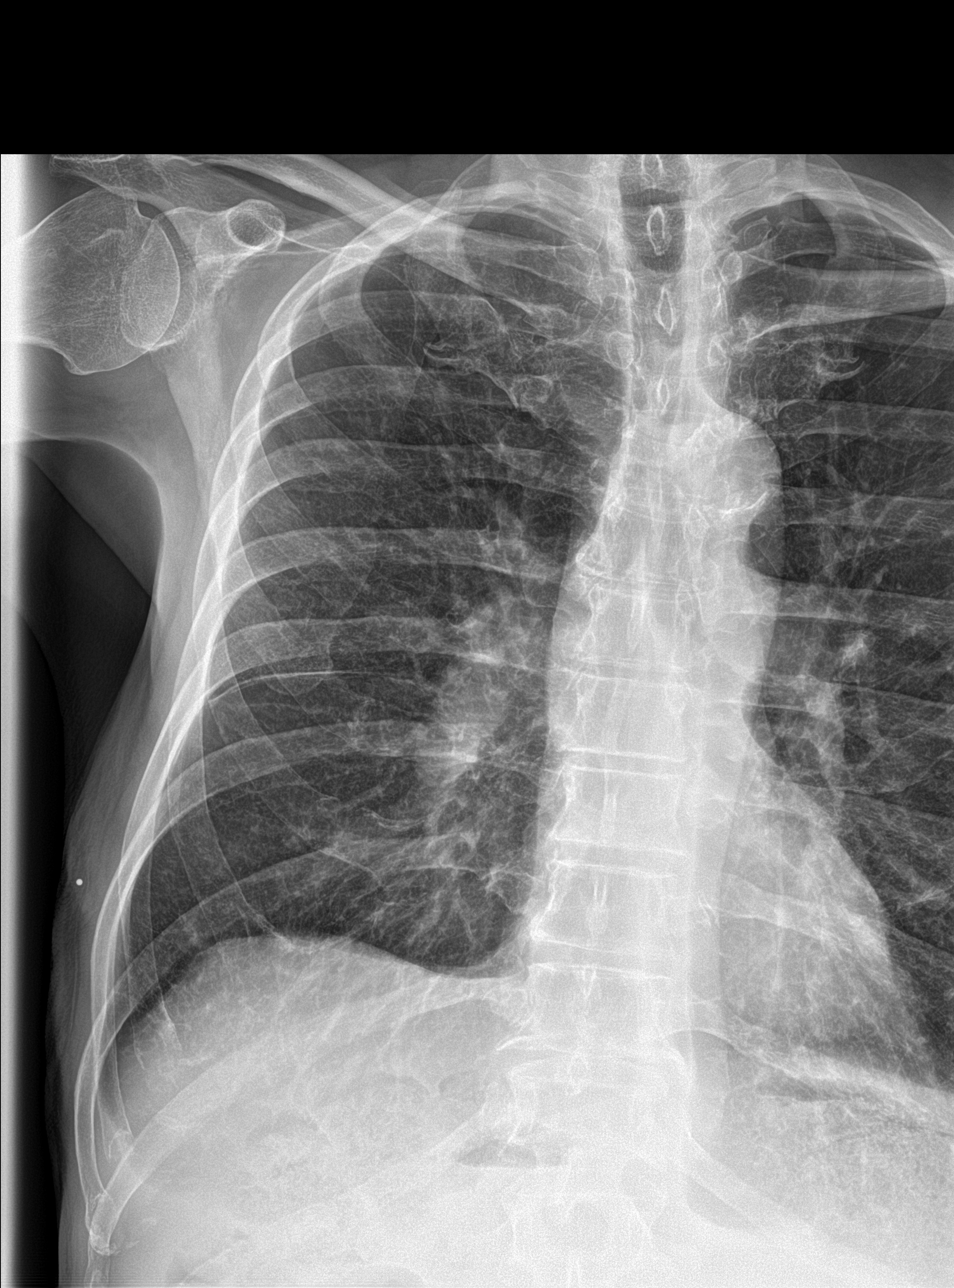

[rib pa (2 of 2)]
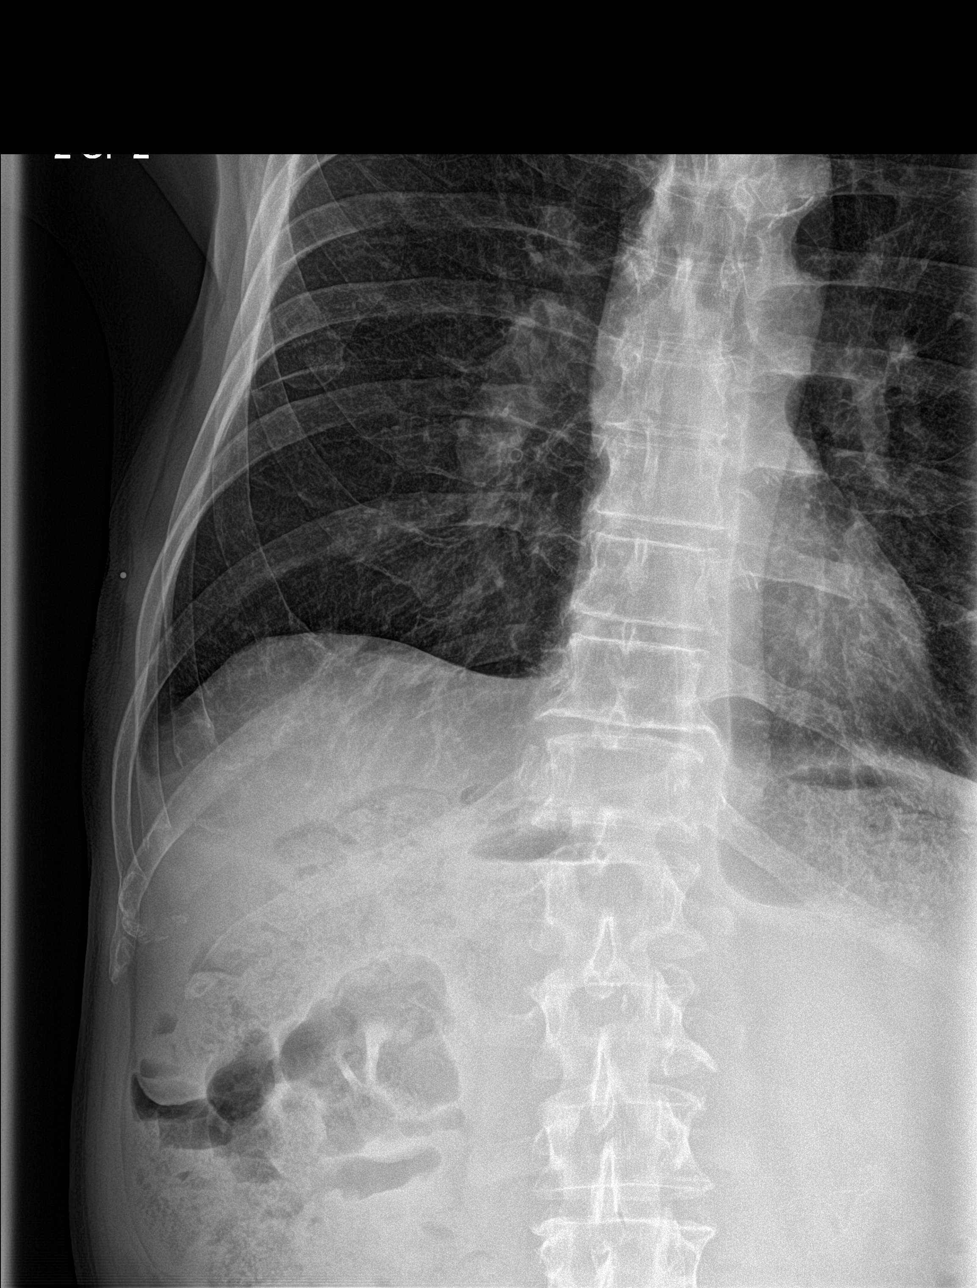

[rib obl (1 of 2)]
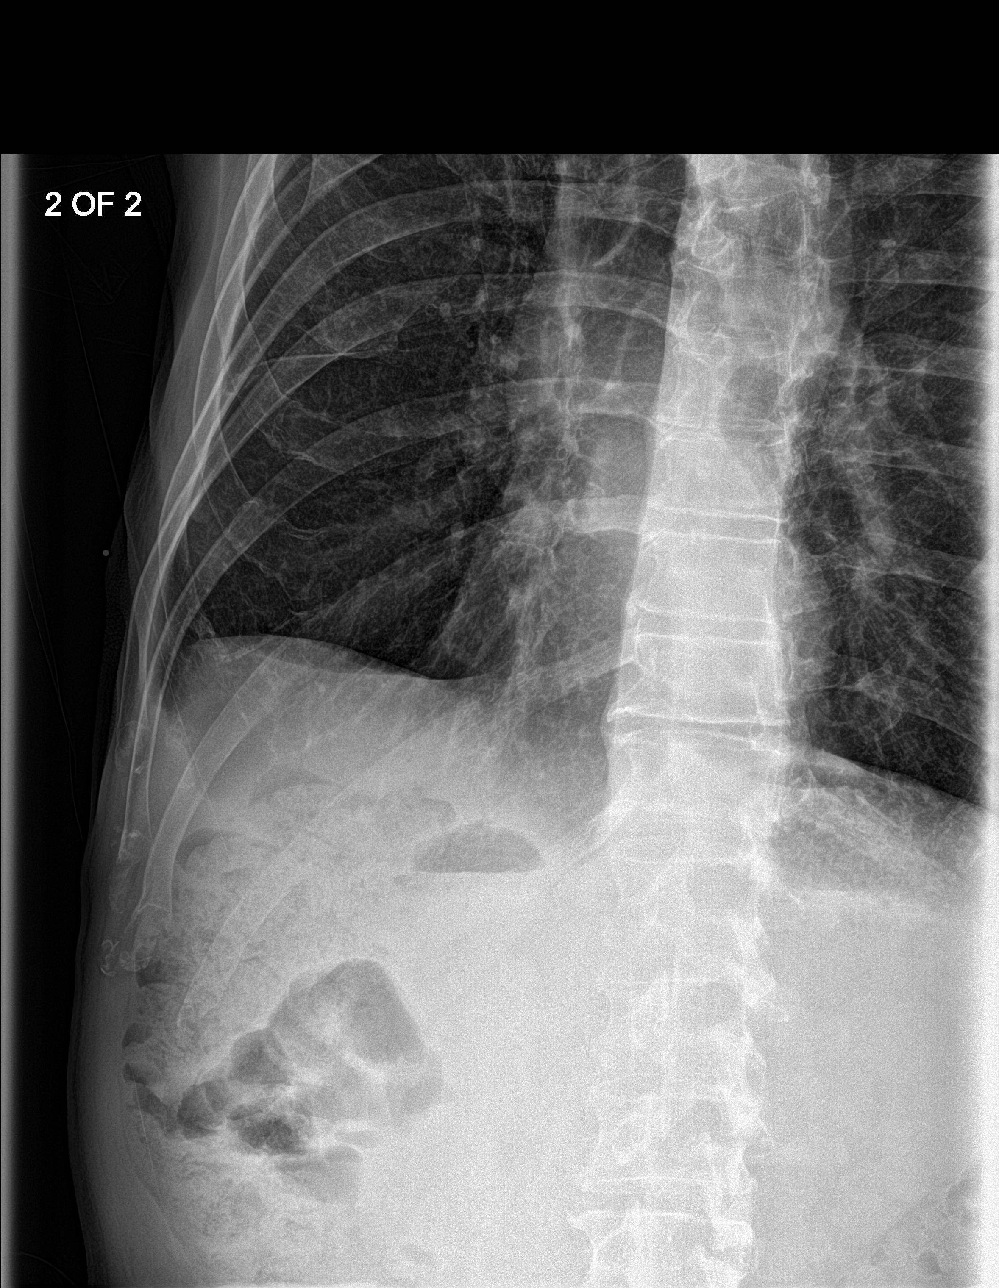

[rib obl (2 of 2)]
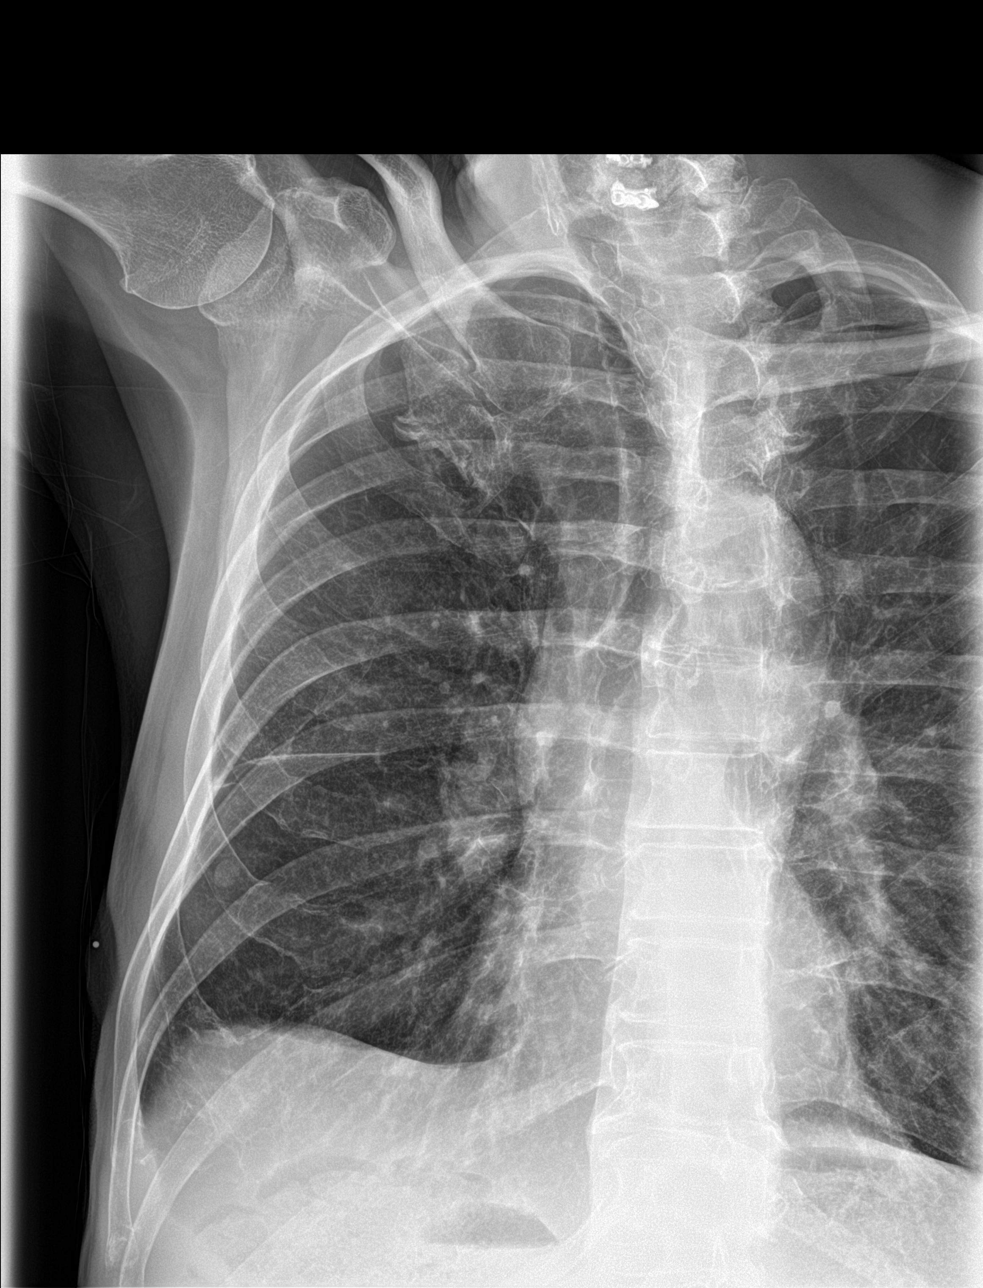

[4 of 4 positions shown; findings below may reference images not displayed]

FINDINGS: No fracture or other bone lesions are seen involving the ribs. A BB
was placed at the site of maximal tenderness, no radiograph rib
abnormalities adjacent to the BB. Mild emphysema in the right lung.
No acute or focal pulmonary opacity. Heart is normal in size.
Surgical hardware in the cervical spine is partially included.
IMPRESSION: Negative radiographs of the right ribs. No fracture or other focal
bone lesion.

## 2021-06-12 ENCOUNTER — Other Ambulatory Visit: Payer: Self-pay | Admitting: Family Medicine

## 2021-06-12 DIAGNOSIS — E1165 Type 2 diabetes mellitus with hyperglycemia: Secondary | ICD-10-CM

## 2021-06-12 DIAGNOSIS — M542 Cervicalgia: Secondary | ICD-10-CM

## 2021-06-12 NOTE — Telephone Encounter (Signed)
Requested medication (s) are due for refill today: yes  Requested medication (s) are on the active medication list: yes  Last refill:  for both meds: 02/24/20  Future visit scheduled: no  Notes to clinic:  called pt and LM to call office to make appt and needs labs NT not delegated to refill cyclobenzaprine   Requested Prescriptions  Pending Prescriptions Disp Refills   simvastatin (ZOCOR) 40 MG tablet [Pharmacy Med Name: SIMVASTATIN TABS 40MG ] 90 tablet     Sig: TAKE 1 TABLET DAILY     Cardiovascular:  Antilipid - Statins Failed - 06/12/2021  9:08 AM      Failed - Total Cholesterol in normal range and within 360 days    Cholesterol  Date Value Ref Range Status  06/04/2019 161 <200 mg/dL Final          Failed - LDL in normal range and within 360 days    LDL Cholesterol (Calc)  Date Value Ref Range Status  06/04/2019 83 mg/dL (calc) Final    Comment:    Reference range: <100 . Desirable range <100 mg/dL for primary prevention;   <70 mg/dL for patients with CHD or diabetic patients  with > or = 2 CHD risk factors. 08/04/2019 LDL-C is now calculated using the Martin-Hopkins  calculation, which is a validated novel method providing  better accuracy than the Friedewald equation in the  estimation of LDL-C.  Marland Kitchen et al. Horald Pollen. Lenox Ahr): 2061-2068  (http://education.[facility].com/faq/FAQ164)           Failed - HDL in normal range and within 360 days    HDL  Date Value Ref Range Status  06/04/2019 49 > OR = 40 mg/dL Final          Failed - Triglycerides in normal range and within 360 days    Triglycerides  Date Value Ref Range Status  06/04/2019 196 (H) <150 mg/dL Final          Passed - Patient is not pregnant      Passed - Valid encounter within last 12 months    Recent Outpatient Visits           4 months ago COPD exacerbation (HCC)   [facility], [facility], DO   8 months ago DOE (dyspnea on exertion)   [facility], [facility], FNP   10 months ago Neuropathy   [facility], [facility], FNP   1 year ago Uncontrolled type 2 diabetes mellitus with hyperglycemia [facility])   [facility], [facility], FNP   1 year ago Rib pain on right side   [facility] [place], [place], DO       Future Appointments             In 6 months [facility], [facility]              cyclobenzaprine (FLEXERIL) 5 MG tablet [Pharmacy Med Name: CYCLOBENZAPRINE HCL TABS 5MG ] 30 tablet     Sig: TAKE 1 TABLET THREE TIMES A DAY AS NEEDED FOR MUSCLE SPASMS     Not Delegated - Analgesics:  Muscle Relaxants Failed - 06/12/2021  9:08 AM      Failed - This refill cannot be delegated      Passed - Valid encounter within last 6 months    Recent Outpatient Visits           4 months ago COPD  exacerbation [facility])   [facility] Althea Charon, [place] Neat, DO   8 months ago DOE (dyspnea on exertion)   [facility], Jodelle Gross, FNP   10 months ago Neuropathy   [facility], Jodelle Gross, FNP   1 year ago Uncontrolled type 2 diabetes mellitus with hyperglycemia [facility] [place])   [facility], Jodelle Gross, FNP   1 year ago Rib pain on right side   [facility] [place], Netta Neat, DO       Future Appointments             In 6 months [facility], [facility]

## 2021-06-12 NOTE — Telephone Encounter (Signed)
Requested Prescriptions  Pending Prescriptions Disp Refills  . simvastatin (ZOCOR) 40 MG tablet [Pharmacy Med Name: SIMVASTATIN TABS 40MG ] 90 tablet     Sig: TAKE 1 TABLET DAILY     Cardiovascular:  Antilipid - Statins Failed - 06/12/2021  9:08 AM      Failed - Total Cholesterol in normal range and within 360 days    Cholesterol  Date Value Ref Range Status  06/04/2019 161 <200 mg/dL Final         Failed - LDL in normal range and within 360 days    LDL Cholesterol (Calc)  Date Value Ref Range Status  06/04/2019 83 mg/dL (calc) Final    Comment:    Reference range: <100 . Desirable range <100 mg/dL for primary prevention;   <70 mg/dL for patients with CHD or diabetic patients  with > or = 2 CHD risk factors. 08/04/2019 LDL-C is now calculated using the Martin-Hopkins  calculation, which is a validated novel method providing  better accuracy than the Friedewald equation in the  estimation of LDL-C.  Marland Kitchen et al. Horald Pollen. Lenox Ahr): 2061-2068  (http://education.[facility].com/faq/FAQ164)          Failed - HDL in normal range and within 360 days    HDL  Date Value Ref Range Status  06/04/2019 49 > OR = 40 mg/dL Final         Failed - Triglycerides in normal range and within 360 days    Triglycerides  Date Value Ref Range Status  06/04/2019 196 (H) <150 mg/dL Final         Passed - Patient is not pregnant      Passed - Valid encounter within last 12 months    Recent Outpatient Visits          4 months ago COPD exacerbation (HCC)   [facility], [facility], DO   8 months ago DOE (dyspnea on exertion)   [facility], [facility], FNP   10 months ago Neuropathy   [facility], [facility], FNP   1 year ago Uncontrolled type 2 diabetes mellitus with hyperglycemia [facility])   [facility], [facility], FNP   1 year ago Rib pain on right side   [facility],  [place], DO      Future Appointments            In 6 months [facility], [facility]            . cyclobenzaprine (FLEXERIL) 5 MG tablet [Pharmacy Med Name: CYCLOBENZAPRINE HCL TABS 5MG ] 30 tablet     Sig: TAKE 1 TABLET THREE TIMES A DAY AS NEEDED FOR MUSCLE SPASMS     Not Delegated - Analgesics:  Muscle Relaxants Failed - 06/12/2021  9:08 AM      Failed - This refill cannot be delegated      Passed - Valid encounter within last 6 months    Recent Outpatient Visits          4 months ago COPD exacerbation [facility])   [facility] [place], [facility], DO   8 months ago DOE (dyspnea on exertion)   [facility], Netta Neat, FNP   10 months ago Neuropathy   [facility], Jodelle Gross, FNP   1 year ago Uncontrolled type 2 diabetes mellitus with hyperglycemia [facility])   [facility] [place]  M, FNP   1 year ago Rib pain on right side   [facility], Netta Neat, DO      Future Appointments            In 6 months [facility], [facility]

## 2021-06-15 ENCOUNTER — Other Ambulatory Visit: Payer: Self-pay

## 2021-06-15 DIAGNOSIS — G629 Polyneuropathy, unspecified: Secondary | ICD-10-CM

## 2021-08-23 DEATH — deceased

## 2021-08-29 ENCOUNTER — Other Ambulatory Visit: Payer: Self-pay | Admitting: Family Medicine

## 2021-08-29 DIAGNOSIS — E1165 Type 2 diabetes mellitus with hyperglycemia: Secondary | ICD-10-CM

## 2021-08-29 NOTE — Telephone Encounter (Signed)
Requested medication (s) are due for refill today: Yes  Requested medication (s) are on the active medication list: Yes  Last refill:  11/30/20  Future visit scheduled: No  Notes to clinic:  Left pt. A message to call and make appointment.    Requested Prescriptions  Pending Prescriptions Disp Refills   JANUVIA 100 MG tablet [Pharmacy Med Name: JANUVIA TABS 100MG ] 90 tablet 3    Sig: TAKE 1 TABLET DAILY     Endocrinology:  Diabetes - DPP-4 Inhibitors Failed - 08/29/2021  2:40 AM      Failed - HBA1C is between 0 and 7.9 and within 180 days    Hemoglobin A1C  Date Value Ref Range Status  02/24/2020 8.5 (A) 4.0 - 5.6 % Final   Hgb A1c MFr Bld  Date Value Ref Range Status  06/04/2019 10.9 (H) <5.7 % of total Hgb Final    Comment:    For someone without known diabetes, a hemoglobin A1c value of 6.5% or greater indicates that they may have  diabetes and this should be confirmed with a follow-up  test. . For someone with known diabetes, a value <7% indicates  that their diabetes is well controlled and a value  greater than or equal to 7% indicates suboptimal  control. A1c targets should be individualized based on  duration of diabetes, age, comorbid conditions, and  other considerations. . Currently, no consensus exists regarding use of hemoglobin A1c for diagnosis of diabetes for children. .           Failed - Valid encounter within last 6 months    Recent Outpatient Visits           7 months ago COPD exacerbation [facility])   [facility], DO   11 months ago DOE (dyspnea on exertion)   [facility], [facility], FNP   1 year ago Neuropathy   [facility], [facility], FNP   1 year ago Uncontrolled type 2 diabetes mellitus with hyperglycemia [facility])   [facility], [facility], FNP   1 year ago Rib pain on right side   [facility] [place], [place], DO        Future Appointments             In 4 months [facility], [facility]             Passed - Cr in normal range and within 360 days    Creat  Date Value Ref Range Status  09/21/2020 1.09 0.70 - 1.25 mg/dL Final    Comment:    For patients >80 years of age, the reference limit for Creatinine is approximately 13% higher for people identified as African-American. 54

## 2021-12-27 ENCOUNTER — Ambulatory Visit: Payer: Medicare Other

## 2022-01-03 ENCOUNTER — Telehealth: Payer: Self-pay

## 2022-01-03 NOTE — Telephone Encounter (Signed)
Copied from CRM [id number]. Topic: General - Deceased Patient ?>> 01/11/2022  2:30 PM Raymond Huff, Raymond Huff wrote: ?Reason for CRM: Pts wife stated the pt passed away in August 17, 2023, pts wife had some follow up questions regarding the death certificate as well, please advise. ?

## 2022-01-04 NOTE — Telephone Encounter (Signed)
Can you call pt with Dr. Althea Charon comment below ?

## 2022-01-04 NOTE — Telephone Encounter (Signed)
Do you recall if you filled out the death certificate on this patient? I'm not really sure how to handle this. I never saw this patient, not sure exactly how he died or what the cause of death is on the death certificate. ?

## 2022-01-04 NOTE — Telephone Encounter (Signed)
I do not believe that I was aware of his passing and have not signed his death certificate. His chart is not marked as deceased, and that would be required by the time we received and signed the death certificate. This is a new update to me. They should look into who signed it etc, maybe if there are questions it as a Adult nurse. ? ?Saralyn Pilar, DO ?[facility] ?[facility] ?01/04/2022, 8:50 AM ? ?

## 2022-01-04 NOTE — Telephone Encounter (Signed)
FYI...  ? ? ?I spoke with Ms. Franssen.  She states she did not have questions about his death certificate.  She just wanted to let us know he past away and to note this in his chart.  ? ?I have updated his chart. ? ? ? ?
